# Patient Record
Sex: Female | Born: 1940 | Race: White | Hispanic: No | Marital: Married | Smoking: Never smoker
Health system: Southern US, Community
[De-identification: ages and names within clinical notes are randomized; demographics above are authoritative.]

## PROBLEM LIST (undated history)

## (undated) DIAGNOSIS — B351 Tinea unguium: Secondary | ICD-10-CM

## (undated) DIAGNOSIS — I5022 Chronic systolic (congestive) heart failure: Secondary | ICD-10-CM

## (undated) DIAGNOSIS — E785 Hyperlipidemia, unspecified: Secondary | ICD-10-CM

## (undated) DIAGNOSIS — G3184 Mild cognitive impairment, so stated: Secondary | ICD-10-CM

## (undated) DIAGNOSIS — A0472 Enterocolitis due to Clostridium difficile, not specified as recurrent: Secondary | ICD-10-CM

## (undated) DIAGNOSIS — I350 Nonrheumatic aortic (valve) stenosis: Secondary | ICD-10-CM

## (undated) DIAGNOSIS — F0390 Unspecified dementia without behavioral disturbance: Secondary | ICD-10-CM

## (undated) DIAGNOSIS — Z87448 Personal history of other diseases of urinary system: Secondary | ICD-10-CM

## (undated) DIAGNOSIS — I701 Atherosclerosis of renal artery: Secondary | ICD-10-CM

## (undated) DIAGNOSIS — I10 Essential (primary) hypertension: Secondary | ICD-10-CM

## (undated) DIAGNOSIS — Z8673 Personal history of transient ischemic attack (TIA), and cerebral infarction without residual deficits: Secondary | ICD-10-CM

## (undated) DIAGNOSIS — D696 Thrombocytopenia, unspecified: Secondary | ICD-10-CM

## (undated) DIAGNOSIS — Z8719 Personal history of other diseases of the digestive system: Secondary | ICD-10-CM

## (undated) HISTORY — DX: Atherosclerosis of renal artery: I70.1

## (undated) HISTORY — DX: Essential (primary) hypertension: I10

## (undated) HISTORY — DX: Nonrheumatic aortic (valve) stenosis: I35.0

## (undated) HISTORY — DX: Personal history of transient ischemic attack (TIA), and cerebral infarction without residual deficits: Z86.73

## (undated) HISTORY — DX: Enterocolitis due to Clostridium difficile, not specified as recurrent: A04.72

## (undated) HISTORY — DX: Chronic systolic (congestive) heart failure: I50.22

## (undated) HISTORY — DX: Hyperlipidemia, unspecified: E78.5

## (undated) HISTORY — DX: Personal history of other diseases of the digestive system: Z87.19

## (undated) HISTORY — DX: Unspecified dementia, unspecified severity, without behavioral disturbance, psychotic disturbance, mood disturbance, and anxiety: F03.90

## (undated) HISTORY — PX: CHOLECYSTECTOMY: SHX55

## (undated) HISTORY — DX: Tinea unguium: B35.1

## (undated) HISTORY — DX: Thrombocytopenia, unspecified: D69.6

## (undated) HISTORY — DX: Personal history of other diseases of urinary system: Z87.448

## (undated) HISTORY — DX: Mild cognitive impairment of uncertain or unknown etiology: G31.84

---

## 2000-03-29 ENCOUNTER — Emergency Department (HOSPITAL_COMMUNITY): Admission: EM | Admit: 2000-03-29 | Discharge: 2000-03-29 | Payer: Self-pay | Admitting: Emergency Medicine

## 2000-03-29 ENCOUNTER — Encounter: Payer: Self-pay | Admitting: Emergency Medicine

## 2004-08-27 ENCOUNTER — Ambulatory Visit: Payer: Self-pay | Admitting: Family Medicine

## 2004-08-27 ENCOUNTER — Ambulatory Visit: Payer: Self-pay | Admitting: Pulmonary Disease

## 2004-08-27 ENCOUNTER — Inpatient Hospital Stay (HOSPITAL_COMMUNITY): Admission: EM | Admit: 2004-08-27 | Discharge: 2004-09-04 | Payer: Self-pay | Admitting: Emergency Medicine

## 2004-08-27 ENCOUNTER — Ambulatory Visit: Payer: Self-pay | Admitting: Cardiology

## 2004-09-11 ENCOUNTER — Ambulatory Visit: Payer: Self-pay | Admitting: Family Medicine

## 2004-10-03 ENCOUNTER — Ambulatory Visit: Payer: Self-pay | Admitting: Family Medicine

## 2004-11-26 ENCOUNTER — Ambulatory Visit: Payer: Self-pay | Admitting: Family Medicine

## 2004-12-18 ENCOUNTER — Ambulatory Visit: Payer: Self-pay | Admitting: Family Medicine

## 2005-01-10 ENCOUNTER — Ambulatory Visit: Payer: Self-pay | Admitting: Sports Medicine

## 2005-01-13 ENCOUNTER — Ambulatory Visit: Payer: Self-pay

## 2005-03-13 ENCOUNTER — Ambulatory Visit: Payer: Self-pay | Admitting: Family Medicine

## 2005-03-28 ENCOUNTER — Ambulatory Visit: Payer: Self-pay | Admitting: Family Medicine

## 2005-05-08 ENCOUNTER — Emergency Department: Payer: Self-pay | Admitting: Emergency Medicine

## 2005-11-06 ENCOUNTER — Inpatient Hospital Stay (HOSPITAL_COMMUNITY): Admission: EM | Admit: 2005-11-06 | Discharge: 2005-11-08 | Payer: Self-pay | Admitting: Emergency Medicine

## 2005-11-07 ENCOUNTER — Encounter (INDEPENDENT_AMBULATORY_CARE_PROVIDER_SITE_OTHER): Payer: Self-pay | Admitting: Cardiology

## 2006-03-09 ENCOUNTER — Emergency Department (HOSPITAL_COMMUNITY): Admission: EM | Admit: 2006-03-09 | Discharge: 2006-03-09 | Payer: Self-pay | Admitting: Family Medicine

## 2006-08-28 ENCOUNTER — Other Ambulatory Visit: Payer: Self-pay

## 2006-08-28 ENCOUNTER — Inpatient Hospital Stay: Payer: Self-pay | Admitting: Internal Medicine

## 2006-09-12 ENCOUNTER — Inpatient Hospital Stay: Payer: Self-pay | Admitting: Internal Medicine

## 2006-09-12 ENCOUNTER — Other Ambulatory Visit: Payer: Self-pay

## 2006-11-03 ENCOUNTER — Inpatient Hospital Stay: Payer: Self-pay | Admitting: Internal Medicine

## 2006-11-03 ENCOUNTER — Other Ambulatory Visit: Payer: Self-pay

## 2006-12-10 DIAGNOSIS — I5022 Chronic systolic (congestive) heart failure: Secondary | ICD-10-CM

## 2006-12-10 DIAGNOSIS — E119 Type 2 diabetes mellitus without complications: Secondary | ICD-10-CM

## 2006-12-10 DIAGNOSIS — I10 Essential (primary) hypertension: Secondary | ICD-10-CM | POA: Insufficient documentation

## 2007-09-17 ENCOUNTER — Emergency Department: Payer: Self-pay | Admitting: Emergency Medicine

## 2007-11-12 ENCOUNTER — Other Ambulatory Visit: Payer: Self-pay

## 2007-11-12 ENCOUNTER — Emergency Department: Payer: Self-pay | Admitting: Internal Medicine

## 2007-12-05 ENCOUNTER — Emergency Department: Payer: Self-pay | Admitting: Emergency Medicine

## 2008-03-22 IMAGING — CR DG CHEST 1V PORT
1 series · 1 of 1 positions shown · non-contrast
Comparison: none

REASON FOR EXAM: sob cardiac room
COMMENTS:

[view not recorded]
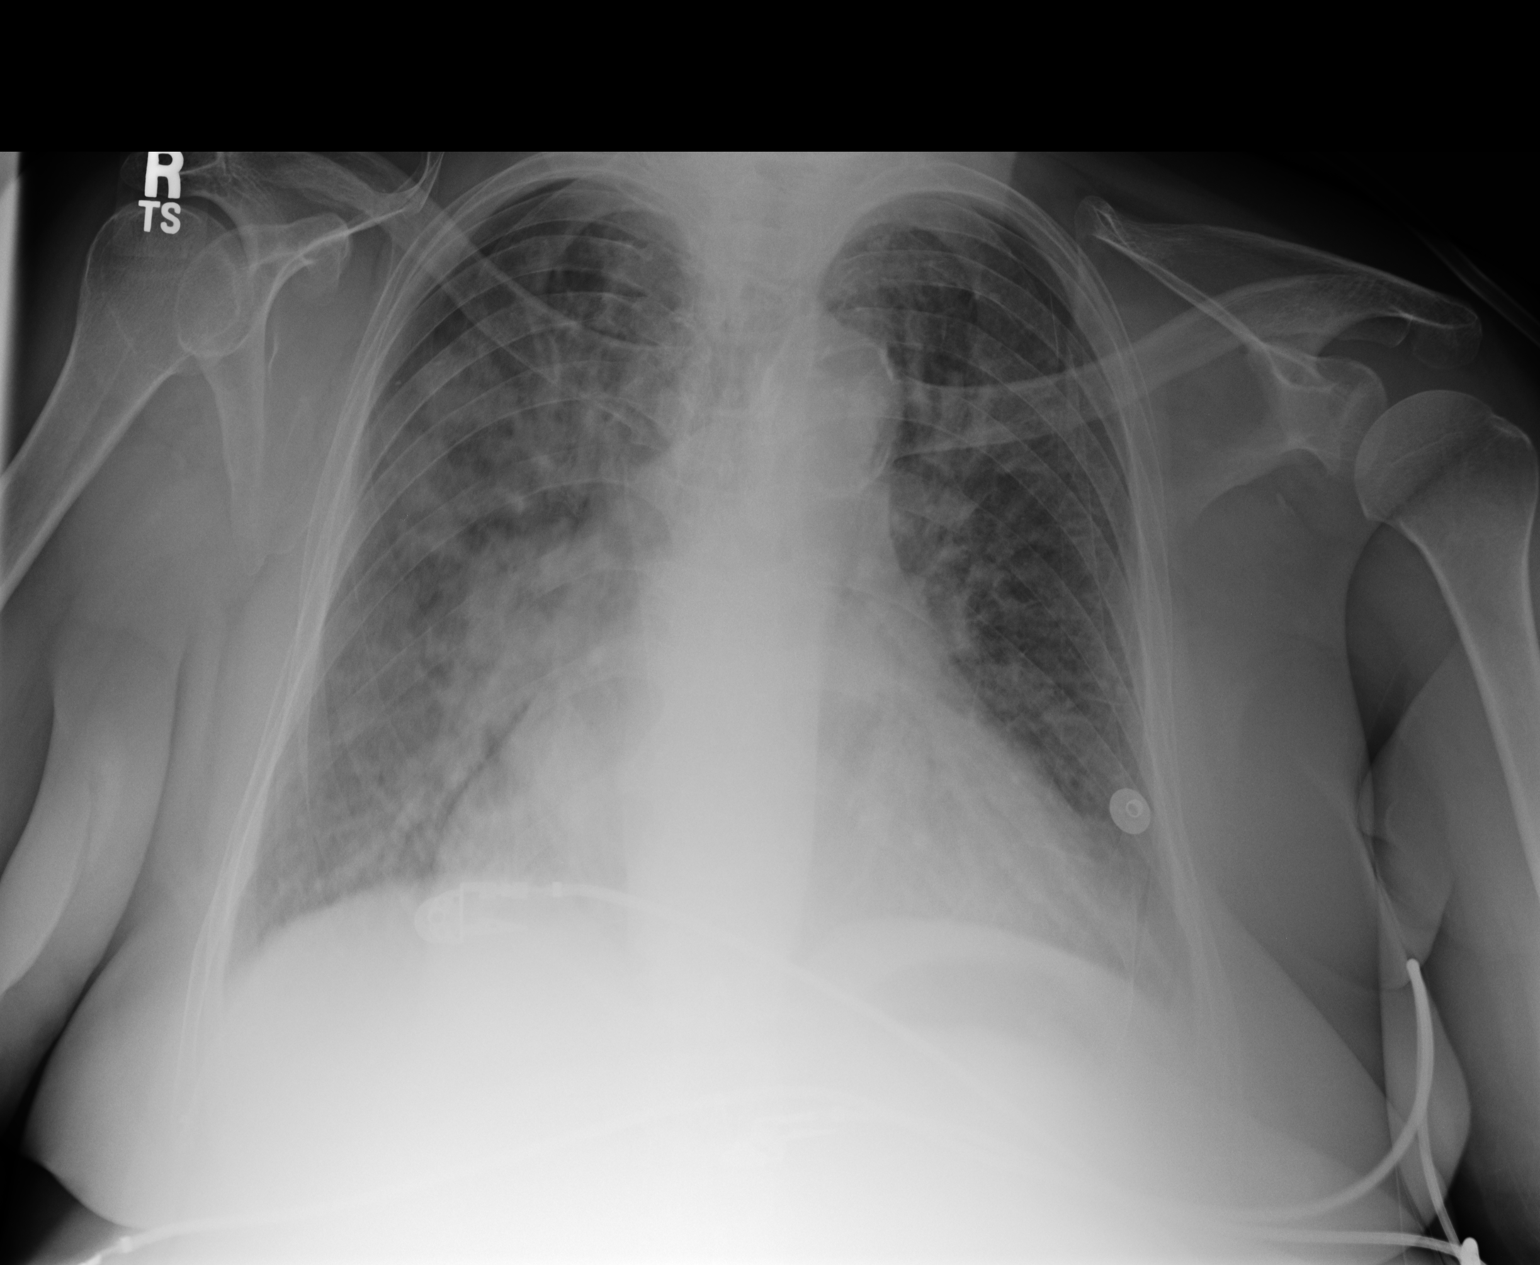

[1 of 1 positions shown; findings below may reference images not displayed]

PROCEDURE:     DXR - DXR PORTABLE CHEST SINGLE VIEW  - August 28, 2006  [DATE]

RESULT:     AP view of the chest shows prominence of the pulmonary
vascularity compatible with pulmonary vascular congestion. There is
increased interstitial density bilaterally consistent with interstitial
edema. In the RIGHT upper lobe there is a semi-consolidated density
compatible with focal pulmonary edema. The heart is enlarged. The general
appearance of the chest is consistent with congestive heart failure. No
frank pleural effusion is identified at this time.
IMPRESSION: 1)The chest shows changes consistent with congestive heart failure as noted
above.

## 2008-04-10 ENCOUNTER — Other Ambulatory Visit: Payer: Self-pay

## 2008-04-11 ENCOUNTER — Inpatient Hospital Stay: Payer: Self-pay | Admitting: Internal Medicine

## 2008-09-19 ENCOUNTER — Emergency Department: Payer: Self-pay | Admitting: Emergency Medicine

## 2008-10-21 ENCOUNTER — Inpatient Hospital Stay: Payer: Self-pay | Admitting: Internal Medicine

## 2009-03-08 ENCOUNTER — Inpatient Hospital Stay (HOSPITAL_COMMUNITY): Admission: EM | Admit: 2009-03-08 | Discharge: 2009-03-10 | Payer: Self-pay | Admitting: Emergency Medicine

## 2009-03-08 ENCOUNTER — Ambulatory Visit: Payer: Self-pay | Admitting: Cardiology

## 2009-03-09 ENCOUNTER — Encounter (INDEPENDENT_AMBULATORY_CARE_PROVIDER_SITE_OTHER): Payer: Self-pay | Admitting: Internal Medicine

## 2009-04-01 ENCOUNTER — Emergency Department: Payer: Self-pay | Admitting: Emergency Medicine

## 2009-05-02 ENCOUNTER — Inpatient Hospital Stay: Payer: Self-pay | Admitting: Internal Medicine

## 2009-05-16 ENCOUNTER — Ambulatory Visit: Payer: Self-pay | Admitting: Family

## 2009-10-15 ENCOUNTER — Inpatient Hospital Stay: Payer: Self-pay | Admitting: Internal Medicine

## 2009-11-13 HISTORY — PX: CARDIAC CATHETERIZATION: SHX172

## 2009-11-15 ENCOUNTER — Ambulatory Visit: Payer: Self-pay | Admitting: Cardiovascular Disease

## 2009-11-19 ENCOUNTER — Emergency Department: Payer: Self-pay | Admitting: Emergency Medicine

## 2010-03-02 ENCOUNTER — Inpatient Hospital Stay: Payer: Self-pay | Admitting: Internal Medicine

## 2010-06-15 ENCOUNTER — Inpatient Hospital Stay: Payer: Self-pay | Admitting: Internal Medicine

## 2010-06-24 ENCOUNTER — Inpatient Hospital Stay (HOSPITAL_COMMUNITY): Admission: EM | Admit: 2010-06-24 | Discharge: 2010-06-26 | Payer: Self-pay | Admitting: Emergency Medicine

## 2010-06-24 ENCOUNTER — Ambulatory Visit: Payer: Self-pay | Admitting: Family Medicine

## 2010-12-26 LAB — CBC
HCT: 42.1 % (ref 36.0–46.0)
HCT: 46.3 % — ABNORMAL HIGH (ref 36.0–46.0)
Hemoglobin: 15.6 g/dL — ABNORMAL HIGH (ref 12.0–15.0)
MCH: 28.6 pg (ref 26.0–34.0)
MCHC: 33.5 g/dL (ref 30.0–36.0)
MCHC: 33.7 g/dL (ref 30.0–36.0)
MCV: 84.7 fL (ref 78.0–100.0)
MCV: 84.8 fL (ref 78.0–100.0)
Platelets: 234 10*3/uL (ref 150–400)
Platelets: 244 10*3/uL (ref 150–400)
RBC: 5.46 MIL/uL — ABNORMAL HIGH (ref 3.87–5.11)
RDW: 13.4 % (ref 11.5–15.5)
RDW: 13.7 % (ref 11.5–15.5)
WBC: 9.1 10*3/uL (ref 4.0–10.5)
WBC: 9.8 10*3/uL (ref 4.0–10.5)

## 2010-12-26 LAB — URINE CULTURE: Colony Count: 15000

## 2010-12-26 LAB — URINE MICROSCOPIC-ADD ON

## 2010-12-26 LAB — BASIC METABOLIC PANEL
BUN: 8 mg/dL (ref 6–23)
CO2: 29 mEq/L (ref 19–32)
Calcium: 9 mg/dL (ref 8.4–10.5)
Chloride: 96 mEq/L (ref 96–112)
Creatinine, Ser: 0.52 mg/dL (ref 0.4–1.2)
GFR calc Af Amer: >60 mL/min (ref >60)
GFR calc non Af Amer: >60 mL/min (ref >60)
Glucose, Bld: 315 mg/dL — ABNORMAL HIGH (ref 70–99)
Potassium: 4.4 mEq/L (ref 3.5–5.1)
Sodium: 135 mEq/L (ref 135–145)

## 2010-12-26 LAB — URINALYSIS, ROUTINE W REFLEX MICROSCOPIC
Bilirubin Urine: NEGATIVE
Glucose, UA: >1000 mg/dL — AB
Ketones, ur: 15 mg/dL — AB
Nitrite: NEGATIVE
Protein, ur: 100 mg/dL — AB
Specific Gravity, Urine: 1.025 (ref 1.005–1.030)
Urobilinogen, UA: 0.2 mg/dL (ref 0.0–1.0)
pH: 6.5 (ref 5.0–8.0)

## 2010-12-26 LAB — GRAM STAIN

## 2010-12-26 LAB — LIPID PANEL
Cholesterol: 233 mg/dL — ABNORMAL HIGH (ref 0–200)
HDL: 39 mg/dL — ABNORMAL LOW (ref >39)
Total CHOL/HDL Ratio: 6 RATIO

## 2010-12-26 LAB — GLUCOSE, CAPILLARY
Glucose-Capillary: 197 mg/dL — ABNORMAL HIGH (ref 70–99)
Glucose-Capillary: 204 mg/dL — ABNORMAL HIGH (ref 70–99)
Glucose-Capillary: 220 mg/dL — ABNORMAL HIGH (ref 70–99)
Glucose-Capillary: 226 mg/dL — ABNORMAL HIGH (ref 70–99)
Glucose-Capillary: 252 mg/dL — ABNORMAL HIGH (ref 70–99)
Glucose-Capillary: 280 mg/dL — ABNORMAL HIGH (ref 70–99)
Glucose-Capillary: 282 mg/dL — ABNORMAL HIGH (ref 70–99)
Glucose-Capillary: 291 mg/dL — ABNORMAL HIGH (ref 70–99)
Glucose-Capillary: 326 mg/dL — ABNORMAL HIGH (ref 70–99)

## 2010-12-26 LAB — CARDIAC PANEL(CRET KIN+CKTOT+MB+TROPI)
Relative Index: INVALID (ref 0.0–2.5)
Troponin I: 0.03 ng/mL (ref 0.00–0.06)

## 2010-12-26 LAB — MRSA PCR SCREENING: MRSA by PCR: NEGATIVE

## 2011-01-02 ENCOUNTER — Inpatient Hospital Stay (HOSPITAL_COMMUNITY)
Admission: EM | Admit: 2011-01-02 | Discharge: 2011-01-05 | DRG: 293 | Disposition: A | Discharge status: Home / Self Care | Payer: Medicare PPO | Source: Ambulatory Visit | Attending: Internal Medicine | Admitting: Internal Medicine

## 2011-01-02 ENCOUNTER — Emergency Department: Payer: Self-pay | Admitting: Unknown Physician Specialty

## 2011-01-02 DIAGNOSIS — IMO0001 Reserved for inherently not codable concepts without codable children: Secondary | ICD-10-CM | POA: Diagnosis present

## 2011-01-02 DIAGNOSIS — E785 Hyperlipidemia, unspecified: Secondary | ICD-10-CM | POA: Diagnosis present

## 2011-01-02 DIAGNOSIS — I509 Heart failure, unspecified: Secondary | ICD-10-CM | POA: Diagnosis present

## 2011-01-02 DIAGNOSIS — F0390 Unspecified dementia without behavioral disturbance: Secondary | ICD-10-CM | POA: Diagnosis present

## 2011-01-02 DIAGNOSIS — E669 Obesity, unspecified: Secondary | ICD-10-CM | POA: Diagnosis present

## 2011-01-02 DIAGNOSIS — Z794 Long term (current) use of insulin: Secondary | ICD-10-CM

## 2011-01-02 DIAGNOSIS — I5023 Acute on chronic systolic (congestive) heart failure: Principal | ICD-10-CM | POA: Diagnosis present

## 2011-01-02 DIAGNOSIS — Z87891 Personal history of nicotine dependence: Secondary | ICD-10-CM

## 2011-01-02 DIAGNOSIS — I10 Essential (primary) hypertension: Secondary | ICD-10-CM | POA: Diagnosis present

## 2011-01-02 LAB — POCT I-STAT, CHEM 8
Calcium, Ion: 1.12 mmol/L (ref 1.12–1.32)
Chloride: 100 mEq/L (ref 96–112)
Glucose, Bld: 348 mg/dL — ABNORMAL HIGH (ref 70–99)
HCT: 43 % (ref 36.0–46.0)
Hemoglobin: 14.6 g/dL (ref 12.0–15.0)
TCO2: 30 mmol/L (ref 0–100)

## 2011-01-03 ENCOUNTER — Emergency Department (HOSPITAL_COMMUNITY): Payer: Medicare PPO

## 2011-01-03 ENCOUNTER — Observation Stay (HOSPITAL_COMMUNITY): Payer: Medicare PPO

## 2011-01-03 LAB — GLUCOSE, CAPILLARY
Glucose-Capillary: 344 mg/dL — ABNORMAL HIGH (ref 70–99)
Glucose-Capillary: 269 mg/dL — ABNORMAL HIGH (ref 70–99)
Glucose-Capillary: 214 mg/dL — ABNORMAL HIGH (ref 70–99)
Glucose-Capillary: 244 mg/dL — ABNORMAL HIGH (ref 70–99)

## 2011-01-03 LAB — DIFFERENTIAL
Eosinophils Relative: 1 % (ref 0–5)
Lymphocytes Relative: 22 % (ref 12–46)
Lymphs Abs: 2 10*3/uL (ref 0.7–4.0)
Monocytes Relative: 8 % (ref 3–12)

## 2011-01-03 LAB — CBC
HCT: 40.8 % (ref 36.0–46.0)
HCT: 41.4 % (ref 36.0–46.0)
Hemoglobin: 13.7 g/dL (ref 12.0–15.0)
MCH: 29.2 pg (ref 26.0–34.0)
MCHC: 33.6 g/dL (ref 30.0–36.0)
MCV: 84.8 fL (ref 78.0–100.0)
MCV: 85 fL (ref 78.0–100.0)
RBC: 4.87 MIL/uL (ref 3.87–5.11)
RDW: 13.6 % (ref 11.5–15.5)
WBC: 8.6 10*3/uL (ref 4.0–10.5)
WBC: 9.2 10*3/uL (ref 4.0–10.5)

## 2011-01-03 LAB — CK TOTAL AND CKMB (NOT AT ARMC): Total CK: 33 U/L (ref 7–177)

## 2011-01-03 LAB — BASIC METABOLIC PANEL
Chloride: 99 mEq/L (ref 96–112)
GFR calc non Af Amer: >60 mL/min (ref >60)
Potassium: 4.3 mEq/L (ref 3.5–5.1)
Sodium: 137 mEq/L (ref 135–145)

## 2011-01-03 LAB — POCT CARDIAC MARKERS: Troponin i, poc: <0.05 ng/mL (ref 0.00–0.09)

## 2011-01-03 LAB — CARDIAC PANEL(CRET KIN+CKTOT+MB+TROPI)
CK, MB: 1.2 ng/mL (ref 0.3–4.0)
CK, MB: 1.5 ng/mL (ref 0.3–4.0)
Relative Index: INVALID (ref 0.0–2.5)
Troponin I: 0.02 ng/mL (ref 0.00–0.06)

## 2011-01-03 MED ORDER — XENON XE 133 GAS: 10.0000 | GAS_FOR_INHALATION | Freq: Once | RESPIRATORY_TRACT | Status: AC | PRN

## 2011-01-03 MED ORDER — TECHNETIUM TO 99M ALBUMIN AGGREGATED: 6.0000 | Freq: Once | INTRAVENOUS | Status: AC | PRN

## 2011-01-04 LAB — BASIC METABOLIC PANEL
BUN: 33 mg/dL — ABNORMAL HIGH (ref 6–23)
CO2: 32 mEq/L (ref 19–32)
Calcium: 9.2 mg/dL (ref 8.4–10.5)
Creatinine, Ser: 0.84 mg/dL (ref 0.4–1.2)
Glucose, Bld: 242 mg/dL — ABNORMAL HIGH (ref 70–99)

## 2011-01-04 LAB — GLUCOSE, CAPILLARY: Glucose-Capillary: 190 mg/dL — ABNORMAL HIGH (ref 70–99)

## 2011-01-05 LAB — GLUCOSE, CAPILLARY: Glucose-Capillary: 206 mg/dL — ABNORMAL HIGH (ref 70–99)

## 2011-01-05 LAB — BASIC METABOLIC PANEL
Chloride: 99 mEq/L (ref 96–112)
Creatinine, Ser: 0.67 mg/dL (ref 0.4–1.2)
GFR calc Af Amer: >60 mL/min (ref >60)
Potassium: 3.9 mEq/L (ref 3.5–5.1)

## 2011-01-07 NOTE — H&P (Signed)
NAME:  [name], [name]             ACCOUNT NO.:  [id number]  MEDICAL RECORD NO.:  [id number]           PATIENT TYPE:  LOCATION:                                 FACILITY:  PHYSICIAN:  Jeoffrey Massed, MD    DATE OF BIRTH:  [birth year]  DATE OF ADMISSION: DATE OF DISCHARGE:                             HISTORY & PHYSICAL   PRIMARY CARE PRACTITIONER:  The patient does not remember.  CHIEF COMPLAINT:  Shortness of breath x1 day.  HISTORY OF PRESENT ILLNESS:  The patient is a 70 year old female with a history of systolic heart failure, hypertension, diabetes, dyslipidemia, and early dementia, was brought to the ED by her husband for shortness of breath.  Please note that this patient is a very unreliable historian and most of this history is obtained from the husband who is at bedside. Per the patient and per the patient's husband, apparently this started sometime yesterday afternoon.  It is unknown whether this is actually related to any exertion as the husband says that she looked fine when she went back and forth to the bathroom.  She has chronic two-pillow orthopnea and there is no change to this.  There is no history of PND. There is no cough.  There is no history of fever.  No chest pain or palpitations.  No history of nausea, vomiting, abdominal pain, or dysuria.  ALLERGIES:  PENICILLIN.  PAST MEDICAL HISTORY: 1. Systolic heart failure.  The patient claims she had a cardiac cath     around 6-7 months ago that was negative.  This was apparently done     in [facility]. 2. Systolic heart failure, likely nonischemic given above history. 3. Hypertension. 4. Diabetes. 5. Dyslipidemia.  PAST SURGICAL HISTORY:  None.  MEDICATIONS AT HOME:  Unknown.  Apparently, the patient is on blood pressure medications and Lantus insulin along with NovoLog.  FAMILY HISTORY:  Her brother had coronary artery disease and hypertension.  Father had lung cancer.  SOCIAL HISTORY:  Lives  with the husband.  She is an ex-smoker.  There is no other history of substance abuse.  PHYSICAL EXAMINATION:  VITAL SIGNS:  Temperature of 98.4, heart rate of 116, blood pressure 143/92, respiration of 16, pulse ox of 93% on room air. GENERAL:  Elderly white female lying in bed, does not appear to be in distress.  Not using any accessory muscles, easily able to talk in full sentences. HEENT:  Atraumatic, normocephalic.  Pupils equally reactive to light and accommodation. NECK:  Supple. CHEST:  There is good air entry bilaterally, very few bibasilar rales. CARDIOVASCULAR:  Heart sounds are regular.  No murmurs heard.  Slightly tachycardic. ABDOMEN:  Soft, nontender, nondistended.  The patient is obese. EXTREMITIES:  No edema.  Both the lower extremities are warm to touch. NEUROLOGY:  The patient is alert and awake.  There are no focal neurological deficits.  LABORATORY DATA:  CBC shows WBC of 9.2, hemoglobin of 14.2, hematocrit of 41.4, and a platelet count of 237,000.  ISTAT chemistry shows a sodium of 137, potassium of 4.5, chloride of 100, glucose of 348, BUN of 24, and a creatinine  of 0.8.  BNP is 393.  RADIOLOGICAL STUDIES: 1. Chest x-ray shows cardiomegaly without acute disease. 2. EKG, sinus tachycardia at a rate of around 100 beats per minute     with T-wave inversions in the lateral leads.  ASSESSMENT: 1. Shortness of breath, possibly related to her congestive heart     failure exacerbation. 2. Hypertension, controlled currently. 3. History of diabetes. 4. History of dyslipidemia.  PLAN: 1. The patient will be admitted to telemetry unit. 2. She will be given IV Lasix. 3. Strict I and O will be done along with daily weights. 4. We do not know what medications the patient has been taking as an     outpatient as the husband or she do not have recollection of any of     the names, so at this point we will go ahead and start her on Coreg     and lisinopril and also  simvastatin. 5. Regarding the diabetes, she should be placed on Lantus along with a     sliding scale insulin. 6. Cardiac enzymes will be cycled. 7. Further plan will depend as the patient's clinical course evolves. 8. Code status, full code. 9. DVT prophylaxis with Lovenox.  Total time spent 45 minutes.     Jeoffrey Massed, MD     SG/MEDQ  D:  01/03/2011  T:  01/03/2011  Job:  [id number]  Electronically Signed by Jeoffrey Massed  on 01/07/2011 05:04:06 PM

## 2011-01-21 LAB — DIFFERENTIAL
Basophils Relative: 0 % (ref 0–1)
Basophils Relative: 1 % (ref 0–1)
Eosinophils Absolute: 0.1 10*3/uL (ref 0.0–0.7)
Eosinophils Relative: 1 % (ref 0–5)
Lymphocytes Relative: 24 % (ref 12–46)
Lymphs Abs: 1.5 10*3/uL (ref 0.7–4.0)
Lymphs Abs: 2 10*3/uL (ref 0.7–4.0)
Monocytes Absolute: 0.5 10*3/uL (ref 0.1–1.0)
Monocytes Relative: 4 % (ref 3–12)
Monocytes Relative: 6 % (ref 3–12)
Neutro Abs: 5.8 10*3/uL (ref 1.7–7.7)
Neutrophils Relative %: 68 % (ref 43–77)
Neutrophils Relative %: 78 % — ABNORMAL HIGH (ref 43–77)

## 2011-01-21 LAB — BASIC METABOLIC PANEL
BUN: 17 mg/dL (ref 6–23)
BUN: 19 mg/dL (ref 6–23)
BUN: 22 mg/dL (ref 6–23)
CO2: 27 mEq/L (ref 19–32)
Calcium: 8.7 mg/dL (ref 8.4–10.5)
Calcium: 8.8 mg/dL (ref 8.4–10.5)
Chloride: 93 mEq/L — ABNORMAL LOW (ref 96–112)
Creatinine, Ser: 0.5 mg/dL (ref 0.4–1.2)
Creatinine, Ser: 0.69 mg/dL (ref 0.4–1.2)
GFR calc Af Amer: >60 mL/min (ref >60)
GFR calc non Af Amer: >60 mL/min (ref >60)
GFR calc non Af Amer: >60 mL/min (ref >60)
Glucose, Bld: 251 mg/dL — ABNORMAL HIGH (ref 70–99)
Potassium: 5 mEq/L (ref 3.5–5.1)
Sodium: 130 mEq/L — ABNORMAL LOW (ref 135–145)

## 2011-01-21 LAB — GLUCOSE, CAPILLARY
Glucose-Capillary: 201 mg/dL — ABNORMAL HIGH (ref 70–99)
Glucose-Capillary: 213 mg/dL — ABNORMAL HIGH (ref 70–99)
Glucose-Capillary: 241 mg/dL — ABNORMAL HIGH (ref 70–99)
Glucose-Capillary: 249 mg/dL — ABNORMAL HIGH (ref 70–99)
Glucose-Capillary: 254 mg/dL — ABNORMAL HIGH (ref 70–99)
Glucose-Capillary: 278 mg/dL — ABNORMAL HIGH (ref 70–99)
Glucose-Capillary: 280 mg/dL — ABNORMAL HIGH (ref 70–99)
Glucose-Capillary: 297 mg/dL — ABNORMAL HIGH (ref 70–99)
Glucose-Capillary: 432 mg/dL — ABNORMAL HIGH (ref 70–99)

## 2011-01-21 LAB — COMPREHENSIVE METABOLIC PANEL
Albumin: 3.2 g/dL — ABNORMAL LOW (ref 3.5–5.2)
Alkaline Phosphatase: 110 U/L (ref 39–117)
BUN: 18 mg/dL (ref 6–23)
Calcium: 9.1 mg/dL (ref 8.4–10.5)
Creatinine, Ser: 0.61 mg/dL (ref 0.4–1.2)
Glucose, Bld: 342 mg/dL — ABNORMAL HIGH (ref 70–99)
Total Protein: 6.2 g/dL (ref 6.0–8.3)

## 2011-01-21 LAB — CBC
HCT: 42.1 % (ref 36.0–46.0)
HCT: 43.5 % (ref 36.0–46.0)
HCT: 48.1 % — ABNORMAL HIGH (ref 36.0–46.0)
Hemoglobin: 15 g/dL (ref 12.0–15.0)
MCHC: 34.3 g/dL (ref 30.0–36.0)
MCHC: 34.6 g/dL (ref 30.0–36.0)
MCV: 84.9 fL (ref 78.0–100.0)
MCV: 84.9 fL (ref 78.0–100.0)
MCV: 86.2 fL (ref 78.0–100.0)
Platelets: 180 10*3/uL (ref 150–400)
Platelets: 191 10*3/uL (ref 150–400)
Platelets: 204 10*3/uL (ref 150–400)
Platelets: 216 10*3/uL (ref 150–400)
RDW: 12.9 % (ref 11.5–15.5)
RDW: 13.1 % (ref 11.5–15.5)
WBC: 6 10*3/uL (ref 4.0–10.5)
WBC: 9 10*3/uL (ref 4.0–10.5)

## 2011-01-21 LAB — POCT CARDIAC MARKERS
Myoglobin, poc: 39.3 ng/mL (ref 12–200)
Troponin i, poc: <0.05 ng/mL (ref 0.00–0.09)

## 2011-01-21 LAB — PROTIME-INR: Prothrombin Time: 12.4 seconds (ref 11.6–15.2)

## 2011-01-21 LAB — CARDIAC PANEL(CRET KIN+CKTOT+MB+TROPI)
CK, MB: 1.2 ng/mL (ref 0.3–4.0)
CK, MB: 1.4 ng/mL (ref 0.3–4.0)
Relative Index: INVALID (ref 0.0–2.5)
Relative Index: INVALID (ref 0.0–2.5)
Total CK: 25 U/L (ref 7–177)
Troponin I: 0.03 ng/mL (ref 0.00–0.06)

## 2011-01-21 LAB — LIPID PANEL
Cholesterol: 289 mg/dL — ABNORMAL HIGH (ref 0–200)
Triglycerides: 504 mg/dL — ABNORMAL HIGH (ref <150)

## 2011-01-21 LAB — CK TOTAL AND CKMB (NOT AT ARMC)
Relative Index: INVALID (ref 0.0–2.5)
Total CK: 30 U/L (ref 7–177)

## 2011-01-21 LAB — D-DIMER, QUANTITATIVE: D-Dimer, Quant: 0.27 ug/mL-FEU (ref 0.00–0.48)

## 2011-01-21 LAB — HEMOGLOBIN A1C: Mean Plasma Glucose: 295 mg/dL

## 2011-01-21 LAB — TSH: TSH: 0.986 u[IU]/mL (ref 0.350–4.500)

## 2011-01-27 ENCOUNTER — Emergency Department (HOSPITAL_COMMUNITY): Payer: Medicare PPO

## 2011-01-27 ENCOUNTER — Inpatient Hospital Stay (HOSPITAL_COMMUNITY)
Admission: EM | Admit: 2011-01-27 | Discharge: 2011-02-04 | DRG: 292 | Disposition: A | Discharge status: Skilled Nursing Facility | Payer: Medicare PPO | Attending: Internal Medicine | Admitting: Internal Medicine

## 2011-01-27 DIAGNOSIS — IMO0001 Reserved for inherently not codable concepts without codable children: Secondary | ICD-10-CM | POA: Diagnosis present

## 2011-01-27 DIAGNOSIS — I2589 Other forms of chronic ischemic heart disease: Secondary | ICD-10-CM | POA: Diagnosis present

## 2011-01-27 DIAGNOSIS — I5043 Acute on chronic combined systolic (congestive) and diastolic (congestive) heart failure: Principal | ICD-10-CM | POA: Diagnosis present

## 2011-01-27 DIAGNOSIS — R0902 Hypoxemia: Secondary | ICD-10-CM | POA: Diagnosis present

## 2011-01-27 DIAGNOSIS — Z7982 Long term (current) use of aspirin: Secondary | ICD-10-CM

## 2011-01-27 DIAGNOSIS — Z794 Long term (current) use of insulin: Secondary | ICD-10-CM

## 2011-01-27 DIAGNOSIS — R269 Unspecified abnormalities of gait and mobility: Secondary | ICD-10-CM | POA: Diagnosis present

## 2011-01-27 DIAGNOSIS — I509 Heart failure, unspecified: Secondary | ICD-10-CM | POA: Diagnosis present

## 2011-01-27 DIAGNOSIS — E876 Hypokalemia: Secondary | ICD-10-CM | POA: Diagnosis not present

## 2011-01-27 DIAGNOSIS — E871 Hypo-osmolality and hyponatremia: Secondary | ICD-10-CM | POA: Diagnosis present

## 2011-01-27 DIAGNOSIS — E785 Hyperlipidemia, unspecified: Secondary | ICD-10-CM | POA: Diagnosis present

## 2011-01-27 DIAGNOSIS — I10 Essential (primary) hypertension: Secondary | ICD-10-CM | POA: Diagnosis present

## 2011-01-27 DIAGNOSIS — F0390 Unspecified dementia without behavioral disturbance: Secondary | ICD-10-CM | POA: Diagnosis present

## 2011-01-27 DIAGNOSIS — I359 Nonrheumatic aortic valve disorder, unspecified: Secondary | ICD-10-CM | POA: Diagnosis present

## 2011-01-27 DIAGNOSIS — Z79899 Other long term (current) drug therapy: Secondary | ICD-10-CM

## 2011-01-27 LAB — LACTIC ACID, PLASMA: Lactic Acid, Venous: 2 mmol/L (ref 0.5–2.2)

## 2011-01-27 LAB — URINALYSIS, ROUTINE W REFLEX MICROSCOPIC
Glucose, UA: >1000 mg/dL — AB
Ketones, ur: NEGATIVE mg/dL
pH: 7 (ref 5.0–8.0)

## 2011-01-27 LAB — CBC
MCH: 27.7 pg (ref 26.0–34.0)
Platelets: 176 10*3/uL (ref 150–400)
RBC: 5.05 MIL/uL (ref 3.87–5.11)

## 2011-01-27 LAB — URINE MICROSCOPIC-ADD ON

## 2011-01-27 LAB — CARDIAC PANEL(CRET KIN+CKTOT+MB+TROPI)
CK, MB: 1.5 ng/mL (ref 0.3–4.0)
Total CK: 28 U/L (ref 7–177)

## 2011-01-27 LAB — DIFFERENTIAL
Basophils Absolute: 0.1 10*3/uL (ref 0.0–0.1)
Basophils Relative: 1 % (ref 0–1)
Eosinophils Absolute: 0.1 10*3/uL (ref 0.0–0.7)
Monocytes Relative: 7 % (ref 3–12)
Neutrophils Relative %: 78 % — ABNORMAL HIGH (ref 43–77)

## 2011-01-27 LAB — GLUCOSE, CAPILLARY: Glucose-Capillary: 205 mg/dL — ABNORMAL HIGH (ref 70–99)

## 2011-01-27 LAB — BASIC METABOLIC PANEL
BUN: 21 mg/dL (ref 6–23)
Calcium: 8.8 mg/dL (ref 8.4–10.5)
Creatinine, Ser: 0.55 mg/dL (ref 0.4–1.2)
GFR calc non Af Amer: >60 mL/min (ref >60)

## 2011-01-27 LAB — PROTIME-INR: Prothrombin Time: 12.5 seconds (ref 11.6–15.2)

## 2011-01-27 LAB — POCT CARDIAC MARKERS

## 2011-01-28 ENCOUNTER — Observation Stay (HOSPITAL_COMMUNITY): Payer: Medicare PPO

## 2011-01-28 LAB — CARDIAC PANEL(CRET KIN+CKTOT+MB+TROPI)
CK, MB: 1.5 ng/mL (ref 0.3–4.0)
Relative Index: INVALID (ref 0.0–2.5)
Troponin I: 0.05 ng/mL (ref 0.00–0.06)

## 2011-01-28 LAB — GLUCOSE, CAPILLARY
Glucose-Capillary: 166 mg/dL — ABNORMAL HIGH (ref 70–99)
Glucose-Capillary: 247 mg/dL — ABNORMAL HIGH (ref 70–99)

## 2011-01-28 LAB — COMPREHENSIVE METABOLIC PANEL
AST: 18 U/L (ref 0–37)
Albumin: 3.2 g/dL — ABNORMAL LOW (ref 3.5–5.2)
Calcium: 8.9 mg/dL (ref 8.4–10.5)
Creatinine, Ser: 0.69 mg/dL (ref 0.4–1.2)
GFR calc Af Amer: >60 mL/min (ref >60)
GFR calc non Af Amer: >60 mL/min (ref >60)

## 2011-01-28 LAB — CBC
Hemoglobin: 13.2 g/dL (ref 12.0–15.0)
RBC: 4.68 MIL/uL (ref 3.87–5.11)

## 2011-01-28 LAB — URINE CULTURE: Culture  Setup Time: 201204161358

## 2011-01-28 LAB — HEMOGLOBIN A1C
Hgb A1c MFr Bld: 11.7 % — ABNORMAL HIGH (ref <5.7)
Mean Plasma Glucose: 289 mg/dL — ABNORMAL HIGH (ref <117)

## 2011-01-29 DIAGNOSIS — I509 Heart failure, unspecified: Secondary | ICD-10-CM

## 2011-01-29 DIAGNOSIS — I10 Essential (primary) hypertension: Secondary | ICD-10-CM

## 2011-01-29 LAB — GLUCOSE, CAPILLARY
Glucose-Capillary: 159 mg/dL — ABNORMAL HIGH (ref 70–99)
Glucose-Capillary: 178 mg/dL — ABNORMAL HIGH (ref 70–99)

## 2011-01-29 LAB — BASIC METABOLIC PANEL
BUN: 28 mg/dL — ABNORMAL HIGH (ref 6–23)
CO2: 33 mEq/L — ABNORMAL HIGH (ref 19–32)
Chloride: 97 mEq/L (ref 96–112)
Creatinine, Ser: 0.81 mg/dL (ref 0.4–1.2)
Glucose, Bld: 159 mg/dL — ABNORMAL HIGH (ref 70–99)

## 2011-01-29 NOTE — Discharge Summary (Signed)
[name], [name]             ACCOUNT NO.:  [id number]  MEDICAL RECORD NO.:  [id number]           PATIENT TYPE:  O  LOCATION:  4714                         FACILITY:  [facility]  PHYSICIAN:  Clydia Llano, MD       DATE OF BIRTH:  1941/07/08  DATE OF ADMISSION:  01/02/2011 DATE OF DISCHARGE:                              DISCHARGE SUMMARY   PRIMARY CARE PHYSICIAN:  Dr. Welton Flakes in [facility].  REASON FOR ADMISSION:  Shortness of breath.  DISCHARGE DIAGNOSES: 1. Acute-on-chronic systolic heart failure. 2. Hypertension. 3. Diabetes mellitus type 2 uncontrolled. 4. Dyslipidemia.  DISCHARGE MEDICATIONS: 1. Furosemide 20 mg p.o. daily. 2. Lantus 20 units daily at bedtime. 3. Metformin 500 mg p.o. b.i.d. 4. Carvedilol 6.25 mg p.o. b.i.d. 5. Lisinopril 10 mg p.o. daily. 6. Pravachol 10 mg p.o. daily.  BRIEF HISTORY EXAMINATION:  Mrs. Silvio is a 71 year old Caucasian female with history of systolic heart failure, hypertension and diabetes.  The patient has early dementia as well.  Brought to the emergency department by husband because of shortness of breath.  Please note that the patient is very unreliable historian.  Most of the history is obtained from the husband who is at bedside at the time of admission. Apparently, the patient started to have some shortness of breath the day before the admission and the history was very difficult to obtain and it was not clear it is exertion related are not.  The patient has chronic two-pillow orthopnea and there is no change in this.  There is no history of PND, there is no cough, there is no fever, chest pain, palpitations.  The patient admitted for further evaluation.  Upon initial evaluation, BNP was 393.  The patient admitted for further eval.  RADIOLOGY: 1. V/Q scan showed very low probability for acute PE. 2. Chest x-ray January 03, 2011, showed cardiomegaly without acute     disease.  BRIEF HOSPITAL COURSE: 1.  Acute-on-chronic systolic heart failure, mild exacerbation.  The     patient admitted to the hospital for further evaluation.  As     mentioned above, the BNP was high.  The patient started on gentle     diuresis because symptoms are not that significant because symptoms     are mild to moderate.  With the diuresis, the patient's shortness     of breath got better.  Please note that the patient has positive D-     dimers upon time of admission.  V/Q scan was done and showed very     low probability for PE.  At the time of discharge, the patient was     fine, no shortness of breath, complaining about no chest pain.     Echocardiogram was 70 years old in May 2010 showing a left     ventricular ejection fraction of 35%. 2. Diabetes mellitus type 2 controlled.  The patient's hemoglobin A1c     is 12.7 which correlated to mean plasma glucose of 318.  The     patient started on insulin.  The patient will be on the Lantus     insulin  20 units as well as metformin 500 units twice a day.  The     patient to follow up with her primary care physician for further     adjustment.  Seems like the patient was been on insulin before but     she is nonadherent to her insulin. 3. Dyslipidemia.  Her pravastatin was continued. 4. Hypertension.  Blood pressure was reasonably controlled during the     hospital stay.  Systolic blood pressure runs from 104-138.  Her     Coreg and lisinopril continued.  DISCHARGE INSTRUCTIONS: 1. Activity as tolerated. 2. Diet:  Carbohydrate modified diet.  DISPOSITION:  Home.     Clydia Llano, MD     ME/MEDQ  D:  01/05/2011  T:  01/05/2011  Job:  [id number]  cc:   Beverely Risen, MD  Electronically Signed by Clydia Llano  on 01/29/2011 03:39:01 PM

## 2011-01-30 ENCOUNTER — Inpatient Hospital Stay (HOSPITAL_COMMUNITY): Payer: Medicare PPO

## 2011-01-30 LAB — GLUCOSE, CAPILLARY: Glucose-Capillary: 136 mg/dL — ABNORMAL HIGH (ref 70–99)

## 2011-01-30 LAB — BASIC METABOLIC PANEL
BUN: 35 mg/dL — ABNORMAL HIGH (ref 6–23)
CO2: 32 mEq/L (ref 19–32)
Chloride: 94 mEq/L — ABNORMAL LOW (ref 96–112)
Creatinine, Ser: 0.93 mg/dL (ref 0.4–1.2)
Glucose, Bld: 210 mg/dL — ABNORMAL HIGH (ref 70–99)
Potassium: 4.4 mEq/L (ref 3.5–5.1)

## 2011-01-30 LAB — LIPID PANEL
HDL: 44 mg/dL (ref >39)
Total CHOL/HDL Ratio: 5 RATIO
VLDL: UNDETERMINED mg/dL (ref 0–40)

## 2011-01-30 LAB — PHOSPHORUS: Phosphorus: 5.2 mg/dL — ABNORMAL HIGH (ref 2.3–4.6)

## 2011-01-31 LAB — GLUCOSE, CAPILLARY: Glucose-Capillary: 106 mg/dL — ABNORMAL HIGH (ref 70–99)

## 2011-01-31 LAB — BASIC METABOLIC PANEL
Calcium: 9.2 mg/dL (ref 8.4–10.5)
Creatinine, Ser: 0.77 mg/dL (ref 0.4–1.2)
GFR calc Af Amer: >60 mL/min (ref >60)
GFR calc non Af Amer: >60 mL/min (ref >60)
Sodium: 132 mEq/L — ABNORMAL LOW (ref 135–145)

## 2011-02-01 LAB — GLUCOSE, CAPILLARY
Glucose-Capillary: 98 mg/dL (ref 70–99)
Glucose-Capillary: 108 mg/dL — ABNORMAL HIGH (ref 70–99)

## 2011-02-01 LAB — BASIC METABOLIC PANEL
Chloride: 92 mEq/L — ABNORMAL LOW (ref 96–112)
Creatinine, Ser: 0.81 mg/dL (ref 0.4–1.2)
GFR calc Af Amer: >60 mL/min (ref >60)
GFR calc non Af Amer: >60 mL/min (ref >60)

## 2011-02-02 LAB — GLUCOSE, CAPILLARY
Glucose-Capillary: 106 mg/dL — ABNORMAL HIGH (ref 70–99)
Glucose-Capillary: 101 mg/dL — ABNORMAL HIGH (ref 70–99)
Glucose-Capillary: 162 mg/dL — ABNORMAL HIGH (ref 70–99)

## 2011-02-03 LAB — MAGNESIUM: Magnesium: 2.5 mg/dL (ref 1.5–2.5)

## 2011-02-03 LAB — PHOSPHORUS: Phosphorus: 4.7 mg/dL — ABNORMAL HIGH (ref 2.3–4.6)

## 2011-02-03 LAB — BASIC METABOLIC PANEL
Chloride: 97 mEq/L (ref 96–112)
GFR calc Af Amer: >60 mL/min (ref >60)
GFR calc non Af Amer: >60 mL/min (ref >60)
Potassium: 4.5 mEq/L (ref 3.5–5.1)
Sodium: 137 mEq/L (ref 135–145)

## 2011-02-03 LAB — GLUCOSE, CAPILLARY
Glucose-Capillary: 120 mg/dL — ABNORMAL HIGH (ref 70–99)
Glucose-Capillary: 105 mg/dL — ABNORMAL HIGH (ref 70–99)

## 2011-02-04 ENCOUNTER — Encounter: Payer: Self-pay | Admitting: Internal Medicine

## 2011-02-04 LAB — GLUCOSE, CAPILLARY
Glucose-Capillary: 127 mg/dL — ABNORMAL HIGH (ref 70–99)
Glucose-Capillary: 142 mg/dL — ABNORMAL HIGH (ref 70–99)

## 2011-02-08 NOTE — H&P (Signed)
NAMELAVONNA, Miles             ACCOUNT NO.:  [id number]  MEDICAL RECORD NO.:  [id number]           PATIENT TYPE:  E  LOCATION:  [facility]                         FACILITY:  [facility]  PHYSICIAN:  Baltazar Najjar, MD     DATE OF BIRTH:  Jun 10, 1941  DATE OF ADMISSION:  01/27/2011 DATE OF DISCHARGE:                             HISTORY & PHYSICAL   PRIMARY CARE PHYSICIAN:  Unassigned.  CODE STATUS:  DNI.  CHIEF COMPLAINT:  Shortness of breath.  HISTORY OF PRESENT ILLNESS:  Ms. [name] is a 70 year old Caucasian woman with history of systolic congestive heart failure, was last admitted to the hospital in March of this year, presented to the ER today with chief complaint of shortness of breath as per her it started early this morning, noted that the patient is a poor historian.  She has history of dementia and most of the history obtained with the help of her husband who was present at the time of the interview.  As per the patient's husband, the patient had sudden onset of shortness of breath which started early this morning; however, she had previous similar episodes in the past with shortness of breath.  She denies any associated chest pain.  She does admit that she has to use at least 2-3 pills to sleep at night and she stated that she got short of breath at rest and with activity.  As per husband, the patient has cough going on for about a month, dry, nonproductive, no associated fever.  There is no lower extremity edema or any other complaints.  In the ED, the patient was found to be hypoxemic requiring 3 liters of oxygen.  Her chest x-ray showed mild interstitial edema and we are asked to admit her for congestive heart failure exacerbation.  As per husband, the patient was used to be on home oxygen previously and she got off oxygen a year ago as per him because she felt better.  The patient also has not been seen on regular basis by a physician and the last time she was seen  by a physician as per her husband is more than a year ago.  PAST MEDICAL HISTORY: 1. Significant for systolic congestive heart failure.  Last 2-D     echocardiogram done on May 2010 that showed an EF of 35%. 2. Diabetes mellitus type 2. 3. History of hypertension. 4. Hyperlipidemia. 5. Dementia.  ALLERGIES:  Allergic to PENICILLIN.  HOME MEDICATIONS: 1. Lantus insulin 20 units daily at bedtime. 2. Aspirin 81 mg 1 tablet p.o. daily. 3. Pravastatin 10 mg p.o. daily. 4. Metformin 500 mg p.o. twice daily. 5. Lisinopril 10 mg p.o. daily. 6. Lasix 20 mg p.o. daily. 7. Coreg 6.25 mg 1 tablet p.o. daily with meals.  SOCIAL HISTORY:  Lives with her husband in [place].  No history of smoking or drinking.  FAMILY HISTORY:  Significant for coronary artery disease and hypertension in her brother.  REVIEW OF SYSTEMS:  As above in HPI.  Other systems reported negative. CHEST:  Significant for shortness of breath, cough.  CARDIOVASCULAR:  No chest pain.  Positive orthopnea.  GI:  No nausea, no vomiting.  No diarrhea or constipation.  No abdominal pain.  GU:  No dysuria.  No urinary frequency.  No flank pain, constipation.  No fever or chills.  PHYSICAL EXAMINATION:  VITAL SIGNS:  Blood pressure 143/87, pulse of 111, temperature 97.7, O2 sat 97% on 3 liters of oxygen. GENERAL:  She is alert, pleasantly confused in mild distress. NECK:  No JVD. LUNGS:  Bilateral bibasilar rails, no wheezing. CARDIOVASCULAR:  S1 and S2, regular rhythm and rate. ABDOMEN:  Soft, nontender. EXTREMITIES:  No pedal edema. NEUROLOGIC:  The patient is alert and she moves all her extremities.  No focal deficit appreciated.  LABORATORY DATA:  BNP is 333, potassium 4.1, sodium 133, BUN 21, creatinine 0.55.  Urinalysis showed trace leukocyte, 3-6 wbc's.  Cardiac enzymes showed troponin less than 0.05.  WBC is 9.3, hemoglobin 14, hematocrit 42.  RADIOLOGY:  Chest x-ray showed cardiomegaly with mild  interstitial edema or pulmonary vascular congestion suggesting early congestive heart failure.  ASSESSMENT AND PLAN: 1. Acute on chronic systolic congestive heart failure exacerbation. 2. Mild hyponatremia probably secondary to acute on chronic congestive     heart failure exacerbation. 3. History of diabetes mellitus type 2. 4. Hypertension. 5. History of dyslipidemia.  PLAN: 1. Admit to telemetry floor. 2. Cycle cardiac enzymes. 3. We will obtain 2-D echocardiogram to reassess EF and left     ventricular function. 4. Diuresis with IV Lasix, continue Coreg and lisinopril. 5. Daily weights, strict input and output, heart healthy diet. 6. Rest of orders as per heart failure core order set. 7. Diabetes mellitus type 2.  Continue with Lantus insulin and cover     with insulin sliding scale and adjust as needed for optimal     control.  Her last hemoglobin A1c from March 2012 was 12.7, which     is out of control.  She will probably need further adjustment of     her insulin dose during this hospitalization. 8. Hypertension.  Continue with lisinopril and Coreg and adjust as     needed for optimal control. 9. Dyslipidemia.  Continue with statins. 10.Code status.  The patient is DNI. 11.The patient will need to be assigned to a PCP prior to discharge.          ______________________________ Baltazar Najjar, MD     SA/MEDQ  D:  01/27/2011  T:  01/27/2011  Job:  [id number]  Electronically Signed by Hannah Beat MD on 02/08/2011 08:15:43 PM

## 2011-02-11 ENCOUNTER — Encounter: Payer: Self-pay | Admitting: Internal Medicine

## 2011-02-13 NOTE — Consult Note (Signed)
NAME:  [name], [name]             ACCOUNT NO.:  [id number]  MEDICAL RECORD NO.:  [id number]           PATIENT TYPE:  I  LOCATION:  4735                         FACILITY:  [facility]  PHYSICIAN:  Curlee Bogan C. Omarie Parcell, MD, FACCDATE OF BIRTH:  [birth year]  DATE OF CONSULTATION: DATE OF DISCHARGE:                                CONSULTATION   I was asked by Dr. Marthann Schiller to consult on [name], 70 year old white female, who was admitted on April 16 with progressive shortness of breath.  She was found to be in congestive heart failure with an elevated BNP and chest x-ray being consistent with heart failure.  She remarkably had no lower extremity edema.  She has a history of congestive heart failure with her last ejection fraction in May 2010 being 35%.  We do not have any records from [facility], which is where she has received her care in the past.  She is too demented to give me any clear story on specifics. However, 2-D echocardiogram today shows an inferior apical akinetic area consistent with ischemic cardiomyopathy.  Ejection fraction is now 25%- 30%.  She has probably moderate aortic stenosis.  PAST MEDICAL HISTORY: 1. Type 2 diabetes. 2. History of hypertension. 3. Hyperlipidemia. 4. Dementia.  ALLERGIES:  PENICILLIN.  HOME MEDICATIONS: 1. Lantus insulin 20 units daily at bedtime. 2. Aspirin 81 mg a day. 3. Pravastatin 10 mg per day. 4. Metformin 500 mg p.o. b.i.d. 5. Lisinopril 10 mg per day. 6. Lasix 20 mg a day. 7. Coreg 6.25 mg one p.o. daily.  I have just reviewed medicine reconciliation and she is taking carvedilol 6.25 b.i.d.  SOCIAL HISTORY:  She is at home, being cared for by her family in [place].  She does not smoke or drink.  She has significant dementia.  No family is present at the present time.  FAMILY HISTORY:  Significant for coronary artery disease as well as hypertension.  REVIEW OF SYSTEMS:  Really  unremarkable.  PHYSICAL EXAMINATION:  GENERAL:  Her exam this evening shows her to be a very pleasant lady, in no acute distress.  She is lying down without any sign of respiratory distress.  She is overweight, slightly pale, in no acute distress. VITAL SIGNS:  Blood pressure is 137/77, her pulse is 79 and regular, her sats is 95% on 2 liters, temperature is 97.1, respirations 20 and unlabored.  Her weight suggests that this is going up since admission, though looking at her I's and O's, she has been profoundly negative 2035 on April 16, negative 760 on April 17, negative 1245 today.  She had been getting intravenous Lasix. NECK:  Shows no obvious JVD. LUNGS:  Reveal bibasilar rales.  No wheezing or rhonchi. CARDIOVASCULAR:  PMI was difficult to appreciate.  She has normal S1-S2. No gallop.  She has a systolic murmur and her S2 does split. ABDOMEN:  Soft and nontender. EXTREMITIES:  No pedal edema. NEUROLOGIC:  She is alert, very pleasant, but a little confused.  No focal deficits noted.  LABORATORY DATA:  Has been reviewed.  Her BUN and creatinine have increased since admission from 21  over 0.55 to her last being 28 and 0.81.  Sodium is increased since admission with diuresis.  ASSESSMENT: 1. Acute on chronic systolic congestive heart failure, improving with     diuresis. 2. Hyponatremia, now corrected with Lasix and increase free water     excretion. 3. Probable ischemic cardiomyopathy with inferior apical akinetic area     on her echo. 4. Moderate aortic stenosis. 5. Hypertension. 6. History of dyslipidemia. 7. Dementia.  PLAN: 1. I would change her to p.o. Lasix to see how she is doing, but now     would be a little more gentle with 40 mg p.o. b.i.d. 2. Continue follow I's and O's and BMETs. 3. Discontinue Foley.  She has multiple species growing and she has     cloudy urine and I am concerned she will develop a full fledged     urinary tract infection. 4. The patient  will need discharge follow-up within a week with     Cardiology.  We will schedule through our office.  She has no     primary care physician that I can find in the chart and has not     seen a cardiologist in [place] in over a year.  Thank you very much for consultation.     Libni Fusaro C. Daleen Squibb, MD, [facility]     TCW/MEDQ  D:  01/29/2011  T:  01/30/2011  Job:  [id number]  Electronically Signed by Valera Castle MD [facility] on 02/13/2011 08:19:27 AM

## 2011-02-19 NOTE — Discharge Summary (Signed)
[name], Miles             ACCOUNT NO.:  [id number]  MEDICAL RECORD NO.:  [id number]           PATIENT TYPE:  I  LOCATION:  4735                         FACILITY:  [facility]  PHYSICIAN:  Altha Harm, MDDATE OF BIRTH:  02-Jun-1941  DATE OF ADMISSION:  01/27/2011 DATE OF DISCHARGE:  02/04/2011                              DISCHARGE SUMMARY   DISCHARGE DISPOSITION:  Skilled facility for short-term rehab.  FINAL DISCHARGE DIAGNOSES: 1. Acute on chronic systolic heart failure, systolic component     resolved. 2. Cardiomyopathy, likely ischemic present on admission, ejection     fraction of 25% to 30%. 3. Diabetes type 2, uncontrolled. 4. Hyperlipidemia. 5. Hypertension, well controlled. 6. Dementia. 7. Hyponatremia, resolved. 8. Hypokalemia, resolved. 9. Acute-on-chronic diastolic dysfunction, acute component resolved.  DISCHARGE MEDICATIONS: 1. Acetaminophen 650 mg p.o. q.4 h. p.r.n. pain. 2. Carvedilol 3.125 mg p.o. b.i.d. with meals. 3. Digoxin 0.125 mg p.o. daily. 4. Furosemide 40 mg p.o. b.i.d. 5. Glimepiride 2 mg p.o. daily before breakfast. 6. Lantus 18 units subcutaneously at bedtime. 7. Lisinopril 2.5 mg p.o. daily. 8. Metformin 1000 mg p.o. b.i.d. with meals. 9. MiraLax 17 g in 8 ounces of water daily as needed for constipation. 10.Aspirin enteric-coated 81 mg p.o. daily. 11.Pravachol 10 mg p.o. daily.  CONSULTANTS:  [facility].  PROCEDURES:  None.  DIAGNOSTIC STUDIES: 1. CT echocardiogram done on January 28, 2011, which shows systolic     function severely reduced with ejection fraction of 25% to 30%.     There is global hypokinesis and severe insular apical hypokinesis     to akinesis.  There is a calcified false tendon at the apex of the     LV.  No thrombus identified.  The patient also has Doppler     parameters consistent with grade 1 diastolic dysfunction. 2. Portable chest x-ray done on admission, which shows cardiomegaly     without  mild interstitial edema or pulmonary vascular congestion     suggesting early congestive heart failure, or atherosclerosis. 3. Two-view chest x-ray done on the subsequent day which shows     improved pulmonary edema.  CODE STATUS:  Full code.  PRIMARY CARE PHYSICIAN:  Unassigned.  ALLERGIES:  PENICILLIN.  CHIEF COMPLAINT:  Shortness of breath.  HISTORY OF PRESENT ILLNESS:  Please refer to the H and P dictated by Dr. Baltazar Najjar for details of the HPI.  However, in short this is a 70 year old lady with known systolic congestive heart failure who has had very poor long-term followup.  The patient presented to the emergency room with complaints of shortness of breath.  The patient was referred to [facility] for further evaluation and management.  HOSPITAL COURSE: 1. Acute on chronic systolic diastolic dysfunction.  The patient is     known to have chronic systolic dysfunction with an ejection     fraction of approximately 20%.  The patient presents with fluid     volume overload and decompensated or acute on chronic systolic     heart failure.  The patient was diuresed during the hospital     course.  She improved clinically with  a decrease in weight and     negative fluid balance.  Owing to the fact that the patient has     such poor systolic dysfunction and has had such poor followup as an     outpatient, I asked Cardiology to see her.  They made adjustments     to her medication decreasing her ACE inhibitor and her beta-blocker     to allow for an improved blood pressure and further diuresis.  Her     Lasix was increased and the patient was placed on digoxin     intervention during this hospitalization.  Presently, the patient     is well compensated.  She is not requiring any oxygen.  She is able     to participate in therapy without having any significant shortness     of breath.  The patient is being discharged to skilled facility for     continued restorative  rehabilitation and the ultimate goal is to     get her back to her home setting. 2. Diabetes type 2.  The patient's diabetes was uncontrolled as     reflected and a hemoglobin A1c of 11.7.  Adjustments were made to     her medication including an increase in her metformin to 1000 mg     b.i.d. and addition of glimepiride.  With those interventions, the     patient's blood sugars have been ranging in the 120s-160s.  I would     recommend the patient have her blood sugars checked on a before     meals and nightly basis and sliding scale instituted if necessary     by the supervising physician. 3. Cardiomyopathy.  The patient has a cardiomyopathy dating back to     2007.  It is felt that this is likely an ischemic cardiomyopathy;     however, we do not have data to absolutely prove this.     Nevertheless, this cardiomyopathy has led to chronic systolic heart     failure and as noted above the patient was admitted at this time     with acute on chronic heart failure.  The patient is on lisinopril     at a lower dose for better diuresis.  In addition, she is on Coreg     beta-blocker and will continue on that. 4. Hyperlipidemia.  She has continued on her anti-lipid medication. 5. In terms of her hypertension, the patient's blood pressures were     well-controlled with the cardiac regimen in which she has been     placed.  She has Lasix, Coreg, and lisinopril.  Her blood pressures     have been ranging in the 120s, sometimes dropping to the high 90s     when she has immediately received her medications; however, she     recovers well throughout the day and has shown no evidence of     orthostasis. 6. Hyponatremia, resolved with diuresis. 7. Hypokalemia, resolved with replacement.  PHYSICAL EXAMINATION: 1. Acute on chronic systolic diastolic dysfunction.  The patient is     known to have chronic systolic dysfunction with an ejection     fraction of approximately 20%.  The patient presents  with fluid     volume overload and decompensated or acute on chronic systolic     heart failure.  The patient was diuresed during the hospital     course.  She improved clinically with a decrease in weight and  negative fluid balance.  Owing to the fact that the patient has     such poor systolic dysfunction and has had such poor followup as an     outpatient, I asked Cardiology to see her.  They made adjustments     to her medication decreasing her ACE inhibitor and her beta-blocker     to allow for an improved blood pressure and further diuresis.  Her     Lasix was increased and the patient was placed on digoxin     intervention during this hospitalization.  Presently, the patient     is well compensated.  She is not requiring any oxygen.  She is able     to participate in therapy without having any significant shortness     of breath.  The patient is being discharged to skilled facility for     continued restorative rehabilitation and the ultimate goal is to     get her back to her home setting. 2. Diabetes type 2.  The patient's diabetes was uncontrolled as     reflected and a hemoglobin A1c of 11.7.  Adjustments were made to     her medication including an increase in her metformin to 1000 mg     b.i.d. and addition of glimepiride.  With those interventions, the     patient's blood sugars have been ranging in the 120s-160s.  I would     recommend the patient have her blood sugars checked on a before     meals and nightly basis and sliding scale instituted if necessary     by the supervising physician. 3. Cardiomyopathy.  The patient has a cardiomyopathy dating back to     2007.  It is felt that this is likely an ischemic cardiomyopathy;     however, we do not have data to absolutely prove this.     Nevertheless, this cardiomyopathy has led to chronic systolic heart     failure and as noted above the patient was admitted at this time     with acute on chronic heart failure.  The  patient is on lisinopril     at a lower dose for better diuresis.  In addition, she is on Coreg     beta-blocker and will continue on that. 4. Hyperlipidemia.  She has continued on her anti-lipid medication. 5. In terms of her hypertension, the patient's blood pressures were     well-controlled with the cardiac regimen in which she has been     placed.  She has Lasix, Coreg, and lisinopril.  Her blood pressures     have been ranging in the 120s, sometimes dropping to the high 90s     when she has immediately received her medications; however, she     recovers well throughout the day and has shown no evidence of     orthostasis. 6. Hyponatremia, resolved with diuresis. 7. Hypokalemia, resolved with replacement.  PHYSICAL EXAMINATION:  GENERAL:  At the time of discharge the patient is stable. VITAL SIGNS:  Temperature is 97.7, heart rate 78, blood pressure 128/69, respiratory rate 16, O2 sats are 96% on room air. HEENT:  She is normocephalic and atraumatic.  Pupils are equal round and reactive to light and accommodation.  Extraocular movements are intact. Oropharynx is moist.  No exudate, erythema, or lesions are noted. NECK:  Trachea is midline.  No masses, no thyromegaly, no JVD, no carotid bruits. LUNGS:  Clear to auscultation.  No wheezing or rhonchi  noted. CARDIOVASCULAR:  She has got normal S1 and S2.  No murmurs, rubs, or gallops are noted. ABDOMEN:  Obese, soft, nontender, nondistended.  No masses, no hepatosplenomegaly. EXTREMITIES:  No clubbing, cyanosis, or edema. NEUROLOGICAL:  She has no focal neurological deficits. PSYCHIATRIC:  She is alert and oriented x3.  The patient does have some mild cognitive deficits; however, she is able to answer questions relatively accurately.  DIETARY RESTRICTIONS:  The patient should be on a heart-healthy diabetic diet.  Physical restrictions under the supervision of Physical Therapy.  FOLLOWUP:  The patient is to follow up with the  supervising physician at the skilled facility within 3 days and she is to follow up with [facility] in 4-6 weeks.  Total time for this discharge process including face-to-face time 40 minutes.     Altha Harm, MD     MAM/MEDQ  D:  02/04/2011  T:  02/04/2011  Job:  [id number]  Electronically Signed by Marthann Schiller MD on 02/19/2011 02:09:25 PM

## 2011-02-25 NOTE — Discharge Summary (Signed)
[name], [name]             ACCOUNT NO.:  [id number]   MEDICAL RECORD NO.:  [id number]          PATIENT TYPE:  INP   LOCATION:  5504                         FACILITY:  [facility]   PHYSICIAN:  Peggye Pitt, M.D. DATE OF BIRTH:  [birth year]   DATE OF ADMISSION:  03/08/2009  DATE OF DISCHARGE:  03/10/2009                               DISCHARGE SUMMARY   DISCHARGE DIAGNOSES:  1. Chronic systolic congestive heart failure with an ejection fraction      of 35%.  2. Hyponatremia, resolved.  3. Hypertension.  4. Insulin-dependent diabetes mellitus.  5. History of cerebrovascular accident.   DISCHARGE MEDICATIONS:  1. Aspirin 81 mg daily.  2. Coreg 6.25 mg twice daily.  3. Lisinopril 5 mg daily.  4. Zocor 10 mg at bedtime.  5. Insulin 25 units injected subcutaneously at bedtime.  6. NovoLog sliding scale insulin as follows:  She has been instructed      to check her CBGs right before she eats and to cover as follows:      If her CBGs between 121 and 150 two units, between 151 and 200      three units, between 201 and 250 five units, between 251 and 300      eight units, between 301 and 350 eleven units, and greater than 351      fifteen units.   DISPOSITION AND FOLLOWUP:  Ms. [name] is discharged home in stable  condition.  Her new primary care physician will be Dr. Della Goo.   CONSULTATION THIS HOSPITALIZATION:  None.   IMAGES AND PROCEDURES THIS HOSPITALIZATION:  1. A chest x-ray on Mar 07, 2009, that showed cardiomegaly and no      acute abnormalities.  2. A CT angiogram of the chest on Mar 08, 2009, that was negative for      PE with marked cardiomegaly, extensive atherosclerosis, and an      aberrant right subclavian artery.  3. The patient also had a 2-D echocardiogram on Mar 09, 2009, that      showed an ejection fraction of 35% with moderate left ventricular      hypertrophy.  Wall thickness was increased.   HISTORY AND PHYSICAL EXAM:  For full details,  please see dictation by  Dr. Ninfa Linden on Mar 08, 2009, but in brief Ms. [name] is a pleasant 70-year-  old Caucasian lady who came in with shortness of breath.  She had ran  out of her medications because she was unable to afford them and also  because she lacked a primary care physician to prescribe her  medications.  For that reason, she was admitted for further evaluation  and management.   HOSPITAL COURSE:  1. Chronic systolic congestive heart failure.  A repeat echocardiogram      in the hospital has showed an ejection fraction of 35%.  She is on      adequate medical treatment including a beta-blocker and ACE      inhibitor and statin and aspirin.  2. Hyponatremia with an initial sodium of 128 which has resolved on      day of  discharge to 136 with just IV fluids.  3. Hypertension.  It has been well controlled this hospitalization.      In fact, her blood pressure has been a little bit low with      systolics in the 90s prompting a decrease in the doses of her Coreg      and lisinopril.  4. Insulin-dependent diabetes mellitus.  She has been maintained on      Lantus and NovoLog insulin while in the hospital.  Her CBGs      remained elevated.  We have increased her Lantus to 25 units at      bedtime.  Further titration of her insulin can be accomplished in      the outpatient setting.  5. Rest of chronic medical issues have not been a problem this      hospitalization.   DISCHARGE VITAL SIGNS:  Blood pressure 99/58, heart rate 91,  respirations 20, and O2 sats 96% on room air with a temperature of 98.6.   DISCHARGE LABORATORY DATA:  Sodium 136, potassium 4.5, chloride 102,  bicarb 29, BUN 19, and creatinine 0.69 with a glucose of 221.  WBC 6.0,  hemoglobin 14.1, and a platelet count of 180.      Peggye Pitt, M.D.  Electronically Signed     EH/MEDQ  D:  03/10/2009  T:  03/11/2009  Job:  [id number]   cc:   Della Goo, M.D.

## 2011-02-25 NOTE — H&P (Signed)
[name], [name]             ACCOUNT NO.:  [id number]   MEDICAL RECORD NO.:  [id number]          PATIENT TYPE:  INP   LOCATION:  5504                         FACILITY:  [facility]   PHYSICIAN:  Oswald Hillock, MD        DATE OF BIRTH:  07-28-1941   DATE OF ADMISSION:  03/07/2009  DATE OF DISCHARGE:                              HISTORY & PHYSICAL   CHIEF COMPLAINT:  Respiratory distress.   HISTORY OF PRESENT ILLNESS:  The patient is a 70 year old Caucasian  female with history of multiple comorbidities including diabetes  mellitus, congestive heart failure, hypertension and a CVA who presents  to the ER with complaints of progressively worsening shortness of breath  that began about 3 days back.  The patient apparently ran out of her  medications about 3-4 weeks ago and has not had any since.  She denies  any chest pain, dizziness, loss of consciousness or any focal weakness  of any part of the body though she does complain of some numbness in her  toes for the last 2 weeks.  She denies any fever, rigors, chills, cough  or any urinary symptoms.   PAST MEDICAL HISTORY:  Significant for:  1. Hypertension.  2. Congestive heart failure.  3. History of CVA.  4. History of CHF.  5. History of diabetes mellitus.  6. History of hyponatremia in the past.   PAST SURGICAL HISTORY:  None.   CURRENT MEDICATIONS:  The patient is currently taking no medications  though she does remember being on NovoLog, Lantus and Coreg in addition  to lisinopril dosage unknown.   ALLERGIES:  PENICILLIN.   FAMILY HISTORY:  Significant for hypertension and coronary artery  disease.   SOCIAL HISTORY:  The patient denies any alcohol, tobacco or drug use.  States that she is independent in all her ADLs and has been noncompliant  with her medications.   REVIEW OF SYSTEMS:  An extensive review of system was done and all  systems are negative except for the positives mentioned in history of  present  illness.   PHYSICAL EXAMINATION:  VITALS:  On admission pulse of 129, blood  pressure 172/105, respiratory rate of 22, O2 sats of 93% on room air  initially and then it dropped to 89% on room air.  GENERAL:  The patient is conscious, alert, oriented to time, place and  person in mild-to-moderate respiratory distress.  HEENT:  No scleral icterus.  No pallor.  Ears negative.  Poor dental  hygiene.  NECK:  Supple.  No lymphadenopathy.  No JVD.  No carotid bruits.  CHEST:  Breath sounds heard bilaterally.  Fair air entry.  Occasional  rhonchi.  Diminished breath sounds at bases.  CV:  S1 and S2 plus regular tachycardia.  No gallop, rub or murmur  appreciated.  ABDOMEN:  Soft, obese, nontender.  Bowel sounds present.  EXTREMITIES: No cyanosis, clubbing or edema.  NEUROLOGIC:  Cranial nerves II-XII are grossly intact.  The patient  moves all four extremities.   LABORATORY DATA:  WBC count 9, hemoglobin 16.4, hematocrit 48.1,  platelet count of 216.  PT 12.5, INR 0.19.  Her D-dimer was 0.27, CK was  39.3, MB 1.1, troponin less than 0.05.  Her chest x-ray showed  cardiomegaly.  Her EKG showed sinus tachycardia, left axis deviation,  nonspecific ST changes.   IMPRESSION AND PLAN:  This is a case of a 70 year old Caucasian female  with multiple comorbidities and history of noncompliance with  medications who presents with respiratory distress.  1. Respiratory distress, etiology unclear at this time.  Her BNP is      essentially within normal limits.  Her chest x-ray does not show      any acute findings and her clinical examination is not consistent      with congestive heart failure exacerbation either.  She is      hypoxemic and tachycardic and even though her D-dimer is within      normal limits, we will get a CT of her chest to rule out a      pulmonary embolus.  For now, the patient will be put on oxygen      supplementation and we will monitor her closely.  We will also      recycle  her enzymes and follow up with the results.  2. She is currently on an inch of Nitropaste and we will continue that      as she may have unstable angina.  3. Diabetes mellitus uncontrolled.  We will give her a normal saline      bolus of 500 mL for now, put her on sliding-scale insulin coverage      with regular insulin sensitive scale.  4. Hypertension.  We will resume her lisinopril 10 mg daily, restart      her Coreg at 12.5 mg b.i.d. and get an updated medication list from      her primary care physician's office  5. History of cerebrovascular accident.  Continue aspirin.  6. History of hyperlipidemia.  Check fasting lipid panel and continue      Zocor 10 mg daily.  7. Deep vein thrombosis/gastrointestinal prophylaxis Protonix and      Lovenox.   CODE STATUS:  Full code.      Oswald Hillock, MD  Electronically Signed     BA/MEDQ  D:  03/08/2009  T:  03/08/2009  Job:  [id number]

## 2011-02-28 NOTE — H&P (Signed)
NAME:  [name], [name]             ACCOUNT NO.:  [id number]   MEDICAL RECORD NO.:  [id number]          PATIENT TYPE:  INP   LOCATION:  1828                         FACILITY:  [facility]   PHYSICIAN:  Kela Millin, M.D.DATE OF BIRTH:  Jul 22, 1941   DATE OF ADMISSION:  11/05/2005  DATE OF DISCHARGE:                                HISTORY & PHYSICAL   PRIMARY CARE PHYSICIAN:  Unassigned.   CHIEF COMPLAINT:  Worsening shortness of breath.   HISTORY OF PRESENT ILLNESS:  The patient is a 70 year old white female with  past medical history significant for congestive heart failure, diabetes  mellitus, past history of CVA who presents with complaints of shortness of  breath that has worsened over the past week. She admits to a longstanding  two pillow orthopnea that is unchanged. She denies PND. She has had  worsening leg swelling for the past one to two days. She denies chest pain,  nausea, vomiting. She admits to a slight nonproductive cough. She also  admits to polydipsia, polyuria for the past few months. She states she has  not taken her medications for the past several months and has not seen her  primary care physician, Dr. Tanya Nones, in about a year.   The patient was seen in the ER and chest x-ray revealed interstitial edema  and asymmetric airspace disease on the right. Her B-type natriuretic peptide  was 224, her blood sugar was elevated and she was also found to be febrile.  She was admitted to the [facility] for further evaluation and  management.   PAST MEDICAL HISTORY:  As stated above.   MEDICATIONS:  None for the past several months. She states that she was  previously on insulin but she does not remember what kind of insulin.   ALLERGIES:  PENICILLIN.   SOCIAL HISTORY:  She quit tobacco two years ago. Denies alcohol.   FAMILY HISTORY:  Her mother had diabetes mellitus.   REVIEW OF SYSTEMS:  As per HPI. Other review of systems negative.   PHYSICAL  EXAMINATION:  GENERAL:  The patient is an obese, elderly white  female in no respiratory distress.  VITAL SIGNS:  Her blood pressure was initially 186/108, currently 157/76  (after IV Lasix). Temperature 100.8 and her pulse 111. Her O2 saturation is  98%, initially 89%.  HEENT:  PERRL. EOMI. Sclerae anicteric. Moist mucus membranes. No oral  exudates.  NECK:  Supple. No adenopathy. No JVD. No carotid bruits appreciated.  LUNGS:  A few basilar crackles. Moderate air movement. No wheezes  appreciated.  CARDIOVASCULAR:  Tachycardic. Normal S1 and S2. Regular rhythm. No S3  appreciated.  ABDOMEN:  Obese. Bowel sounds present. Nontender and nondistended. No  organomegaly.  EXTREMITIES:  There is +2 edema. No cyanosis.  NEUROLOGICAL:  Alert and oriented x3. Cranial nerves II through XII grossly  intact. Nonfocal exam.   LABORATORY DATA:  The chest x-ray shows cardiomegaly and interstitial edema.  There is also asymmetric airspace disease on the right, questionable focal  edema versus pneumonia. Her B-type natriuretic peptide is 224. Point of care  markers negative x1. Her  sodium is 130 with a potassium of 3.9. Chloride is  102, BUN 11, glucose 372. A pH 7.55, pCO2 29.9, bicarbonate is 26.1.  Hematocrit is 48, hemoglobin 16.3, white cell count is 9.8, and platelet  count is 262,000. Neutrophil count 77%.   ASSESSMENT/PLAN:  1.  Congestive heart failure exacerbation:  Likely secondary to      noncompliance. We will check cardiac enzymes, a 2-D echocardiogram.      Strict ins and outs and daily weights. We will diurese the patient with      intravenous Lasix and add ACE inhibitors, nitrates and aspirin. Follow      and consider cardiology consult as appropriate pending cardiac enzymes.  2.  Uncontrolled hypertension:  Monitor blood sugars and ACE inhibitor as      above. Cardiac enzymes as above.  3.  Probable pneumonia:  X-ray as above. The patient also febrile. We will      obtain blood  cultures, empiric antibiotics and follow.  4.  Uncontrolled diabetes mellitus:  Accu-Chek monitoring, sliding scale      insulin and Lantus. We will check a hemoglobin A1c as well as a fasting      lipid profile and thyroid-stimulating hormones.  5.  Hyponatremia likely secondary to congestive heart failure and      hyperglycemia as above:  Follow and recheck.  6.  History of cerebrovascular accident.      Kela Millin, M.D.  Electronically Signed     ACV/MEDQ  D:  11/06/2005  T:  11/06/2005  Job:  [id number]   cc:   Broadus John T. Pamalee Leyden, MD  Fax: [phone]

## 2011-02-28 NOTE — Discharge Summary (Signed)
[name]             ACCOUNT NO.:  [id number]   MEDICAL RECORD NO.:  [id number]          PATIENT TYPE:  INP   LOCATION:  2037                         FACILITY:  [facility]   PHYSICIAN:  Sherin Quarry, MD      DATE OF BIRTH:  Apr 22, 1941   DATE OF ADMISSION:  11/05/2005  DATE OF DISCHARGE:                                 DISCHARGE SUMMARY   [name] is a 70 year old lady whose past medical history is  remarkable for previously-diagnosed congestive heart failure, diabetes, and  past history of stroke. She presented to [facility] on November 05, 2005, with a history of two-pillow orthopnea, progressively-worsening  dyspnea on exertion, and progressive leg swelling. The patient had not seen  her previous primary doctor for 1 year prior to presentation and had not  taken any medications for approximately 6 months. On arrival to the ER, a  chest x-ray was obtained which confirmed the diagnosis of congestive heart  failure. Concern was raised about possible increased infiltrate at the right  base which may represent a superimposed pneumonia. Her BNP was 224. She was  noted to have a glucose of 372. In light of uncontrolled diabetes and  findings of congestive heart failure, she was admitted to the [facility]. Physical exam at the time of admission revealed an  obese woman who was in mild respiratory distress. Her blood pressure  initially was 186/108. After receiving intravenous Lasix it was 157/76. Her  temperature was 100.8, pulse was 111, O2 saturation was 89% on room air.  HEENT exam was within normal limits. Examination of the chest revealed few  bibasilar crackles. Cardiovascular exam revealed normal S1 and S2. There  were no rubs or gallops. The abdomen was benign. On neurologic testing the  patient was oriented x3. Motor, sensory, and cerebellar testing was normal.  Examination of extremities revealed 2+ edema. Subsequently, serial cardiac  enzymes were negative. Other relevant laboratory studies obtained included  LDL cholesterol of 139, serial cardiac enzymes which were negative, an A1c  hemoglobin of 14.3, creatinine of 0.6, AST of 36, ALT of 60. The CBC  revealed hemoglobin of 15, white count was 9800.   On admission, the patient was begun on a sliding scale insulin regimen as  well as supplemental Lantus insulin, initially 10 units at bedtime. She was  initially placed on Lasix 40 mg IV every 12 hours. She was placed on Avelox  400 mg IV daily because of concern about possible right lower lobe  pneumonia. Lisinopril 20 mg daily was also initiated. Lovenox protocol was  begun as well. Over time the patient experienced marked improvement in her  overall status. An echocardiogram was obtained which revealed a left  ventricular ejection fraction of 20%. I discussed the patient's diabetes  management with her further and she indicated that her previous physician  had initially tried to manage her diabetes with oral agents. This had not  been effective and up until about 6 months ago she had been taking insulin  on a twice-daily schedule. She was not certain what type of insulin she  had  been receiving or in what dose. It was her impression that this approach had  been effective. By January 27 the patient was feeling entirely well. She had  no orthopnea, no PND, ankle edema had resolved. Her blood pressure was  120/70. Her glucose on January 27 a.m. was 198. Mrs. Dry and I had a  discussion in which I emphasized to her that her medical problems were of  the type that were best managed by frequent outpatient visits and that all  of her medicines would require careful adjustment in the future. She and her  husband reviewed their insurance coverage and decided that she would follow  up with Dr. Jarome Matin at [facility]. I am therefore  sending a copy of this dictation to Dr. Eloise Harman and his partners.    DISCHARGE DIAGNOSES:  1.  Congestive heart failure with echocardiogram revealing diminished      ejection fraction.  2.  Uncontrolled hypertension.  3.  Probable pneumonia.  4.  Uncontrolled diabetes.  5.  History of stroke.   DISCHARGE MEDICATIONS:  1.  Insulin 75/25 30 units in the morning before breakfast, 20 units in the      evening before supper.  2.  Aspirin 81 mg daily.  3.  K-Dur 20 mEq b.i.d.  4.  Lasix, the dose of which has been gradually decreased to 20 mg daily.  5.  Avelox 400 mg daily which will be discontinued on November 12, 2005.  6.  Lisinopril 10 mg daily.   The patient needs to monitor her blood sugars in the morning before  breakfast, in the evening before supper, and keep a record of these for Dr.  Eloise Harman and his partners. I advised her strongly to call and arrange an  appointment on Monday.   CONDITION AT THE TIME OF DISCHARGE:  Fair.           ______________________________  Sherin Quarry, MD     SY/MEDQ  D:  11/08/2005  T:  11/08/2005  Job:  [id number]   cc:   Barry Dienes. Eloise Harman, M.D.  Fax: [phone]

## 2011-03-04 ENCOUNTER — Encounter: Payer: Medicare PPO | Admitting: Cardiovascular Disease

## 2011-03-04 ENCOUNTER — Telehealth: Payer: Self-pay | Admitting: Cardiovascular Disease

## 2011-03-04 NOTE — Telephone Encounter (Signed)
Called pt in regards to missed appt with [facility] on 03/04/11.  There was no answer at the house and there was not a voicemail system.

## 2011-03-05 NOTE — Telephone Encounter (Signed)
Maybe resend a letter?

## 2011-08-04 ENCOUNTER — Inpatient Hospital Stay: Payer: Self-pay | Admitting: Internal Medicine

## 2011-08-13 ENCOUNTER — Ambulatory Visit: Payer: Medicare PPO | Admitting: Internal Medicine

## 2011-08-13 DIAGNOSIS — Z0289 Encounter for other administrative examinations: Secondary | ICD-10-CM

## 2011-10-14 DIAGNOSIS — A0472 Enterocolitis due to Clostridium difficile, not specified as recurrent: Secondary | ICD-10-CM

## 2011-10-14 HISTORY — DX: Enterocolitis due to Clostridium difficile, not specified as recurrent: A04.72

## 2011-11-17 ENCOUNTER — Encounter: Payer: Self-pay | Admitting: Cardiology

## 2011-11-17 ENCOUNTER — Inpatient Hospital Stay: Payer: Self-pay | Admitting: Internal Medicine

## 2011-11-17 DIAGNOSIS — R0602 Shortness of breath: Secondary | ICD-10-CM

## 2011-11-17 LAB — CBC
HCT: 38.9 % (ref 35.0–47.0)
MCH: 28.9 pg (ref 26.0–34.0)
MCHC: 32.9 g/dL (ref 32.0–36.0)
MCV: 88 fL (ref 80–100)
Platelet: 247 10*3/uL (ref 150–440)
RBC: 4.44 10*6/uL (ref 3.80–5.20)
WBC: 12.1 10*3/uL — ABNORMAL HIGH (ref 3.6–11.0)

## 2011-11-17 LAB — COMPREHENSIVE METABOLIC PANEL
Alkaline Phosphatase: 148 U/L — ABNORMAL HIGH (ref 50–136)
Bilirubin,Total: 0.9 mg/dL (ref 0.2–1.0)
Chloride: 99 mmol/L (ref 98–107)
Co2: 28 mmol/L (ref 21–32)
Creatinine: 0.45 mg/dL — ABNORMAL LOW (ref 0.60–1.30)
Osmolality: 292 (ref 275–301)
Sodium: 139 mmol/L (ref 136–145)
Total Protein: 7.1 g/dL (ref 6.4–8.2)

## 2011-11-17 LAB — CK TOTAL AND CKMB (NOT AT ARMC)
CK, Total: 50 U/L (ref 21–215)
CK, Total: 45 U/L (ref 21–215)
CK-MB: 0.7 ng/mL (ref 0.5–3.6)

## 2011-11-17 LAB — URINALYSIS, COMPLETE
Bilirubin,UR: NEGATIVE
Ph: 5 (ref 4.5–8.0)
Protein: 100
RBC,UR: 6 /HPF (ref 0–5)
Specific Gravity: 1.021 (ref 1.003–1.030)
Squamous Epithelial: <1

## 2011-11-17 LAB — PROTIME-INR: INR: 1

## 2011-11-17 LAB — TROPONIN I
Troponin-I: 0.07 ng/mL — ABNORMAL HIGH
Troponin-I: 0.08 ng/mL — ABNORMAL HIGH

## 2011-11-17 LAB — MAGNESIUM: Magnesium: 1.7 mg/dL — ABNORMAL LOW

## 2011-11-18 ENCOUNTER — Encounter: Payer: Self-pay | Admitting: Cardiology

## 2011-11-18 DIAGNOSIS — I5021 Acute systolic (congestive) heart failure: Secondary | ICD-10-CM

## 2011-11-18 LAB — CBC WITH DIFFERENTIAL/PLATELET
Basophil #: 0 10*3/uL (ref 0.0–0.1)
Basophil %: 0.5 %
Eosinophil #: 0.1 10*3/uL (ref 0.0–0.7)
HCT: 34.7 % — ABNORMAL LOW (ref 35.0–47.0)
HGB: 11.2 g/dL — ABNORMAL LOW (ref 12.0–16.0)
Lymphocyte #: 1.5 10*3/uL (ref 1.0–3.6)
Lymphocyte %: 18.3 %
MCHC: 32.4 g/dL (ref 32.0–36.0)
MCV: 88 fL (ref 80–100)
Monocyte #: 0.6 10*3/uL (ref 0.0–0.7)
Neutrophil #: 5.9 10*3/uL (ref 1.4–6.5)
Neutrophil %: 72 %
RDW: 13.8 % (ref 11.5–14.5)

## 2011-11-18 LAB — BASIC METABOLIC PANEL
Anion Gap: 10 (ref 7–16)
Calcium, Total: 8.7 mg/dL (ref 8.5–10.1)
Chloride: 97 mmol/L — ABNORMAL LOW (ref 98–107)
Co2: 35 mmol/L — ABNORMAL HIGH (ref 21–32)
Osmolality: 285 (ref 275–301)
Potassium: 3.5 mmol/L (ref 3.5–5.1)
Sodium: 142 mmol/L (ref 136–145)

## 2011-11-18 LAB — LIPID PANEL
Ldl Cholesterol, Calc: 90 mg/dL (ref 0–100)
Triglycerides: 202 mg/dL — ABNORMAL HIGH (ref 0–200)
VLDL Cholesterol, Calc: 40 mg/dL (ref 5–40)

## 2011-11-18 LAB — PROTIME-INR: Prothrombin Time: 13.8 secs (ref 11.5–14.7)

## 2011-11-18 LAB — HEMOGLOBIN A1C: Hemoglobin A1C: 9.3 % — ABNORMAL HIGH (ref 4.2–6.3)

## 2011-11-24 ENCOUNTER — Observation Stay: Payer: Self-pay | Admitting: Internal Medicine

## 2011-11-24 ENCOUNTER — Encounter: Payer: Self-pay | Admitting: Cardiology

## 2011-11-24 LAB — URINALYSIS, COMPLETE
Bilirubin,UR: NEGATIVE
Glucose,UR: NEGATIVE mg/dL (ref 0–75)
Nitrite: NEGATIVE
Protein: 30
RBC,UR: 1 /HPF (ref 0–5)
Specific Gravity: 1.016 (ref 1.003–1.030)
Squamous Epithelial: 4

## 2011-11-24 LAB — HEMOGLOBIN A1C: Hemoglobin A1C: 8.8 % — ABNORMAL HIGH (ref 4.2–6.3)

## 2011-11-24 LAB — COMPREHENSIVE METABOLIC PANEL
Alkaline Phosphatase: 81 U/L (ref 50–136)
BUN: 33 mg/dL — ABNORMAL HIGH (ref 7–18)
Calcium, Total: 9.3 mg/dL (ref 8.5–10.1)
Chloride: 96 mmol/L — ABNORMAL LOW (ref 98–107)
Co2: 30 mmol/L (ref 21–32)
Creatinine: 0.5 mg/dL — ABNORMAL LOW (ref 0.60–1.30)
EGFR (African American): >60
EGFR (Non-African Amer.): >60
Potassium: 4.4 mmol/L (ref 3.5–5.1)
SGOT(AST): 32 U/L (ref 15–37)
SGPT (ALT): 20 U/L
Sodium: 138 mmol/L (ref 136–145)
Total Protein: 7.7 g/dL (ref 6.4–8.2)

## 2011-11-24 LAB — CBC
HCT: 43 % (ref 35.0–47.0)
MCH: 28.5 pg (ref 26.0–34.0)
MCHC: 32.9 g/dL (ref 32.0–36.0)
Platelet: 211 10*3/uL (ref 150–440)
RBC: 4.95 10*6/uL (ref 3.80–5.20)
WBC: 12.8 10*3/uL — ABNORMAL HIGH (ref 3.6–11.0)

## 2011-11-24 LAB — TSH: Thyroid Stimulating Horm: 1.12 u[IU]/mL

## 2011-11-25 ENCOUNTER — Encounter: Payer: Self-pay | Admitting: Cardiology

## 2011-11-25 LAB — CK TOTAL AND CKMB (NOT AT ARMC)
CK, Total: 24 U/L (ref 21–215)
CK, Total: 19 U/L — ABNORMAL LOW (ref 21–215)
CK-MB: 0.5 ng/mL (ref 0.5–3.6)
CK-MB: 0.5 ng/mL (ref 0.5–3.6)

## 2011-11-25 LAB — BASIC METABOLIC PANEL
Anion Gap: 8 (ref 7–16)
BUN: 39 mg/dL — ABNORMAL HIGH (ref 7–18)
Chloride: 104 mmol/L (ref 98–107)
Creatinine: 1.12 mg/dL (ref 0.60–1.30)
EGFR (African American): >60
Glucose: 135 mg/dL — ABNORMAL HIGH (ref 65–99)
Osmolality: 289 (ref 275–301)

## 2011-11-25 LAB — DIGOXIN LEVEL: Digoxin: 0.84 ng/mL

## 2011-11-25 LAB — CBC WITH DIFFERENTIAL/PLATELET
Basophil %: 1 %
Eosinophil #: 0.1 10*3/uL (ref 0.0–0.7)
Eosinophil %: 1.1 %
HCT: 33 % — ABNORMAL LOW (ref 35.0–47.0)
Lymphocyte #: 2.1 10*3/uL (ref 1.0–3.6)
Lymphocyte %: 27.2 %
MCV: 86 fL (ref 80–100)
Monocyte %: 7.8 %
Neutrophil #: 5 10*3/uL (ref 1.4–6.5)
Neutrophil %: 62.9 %
RBC: 3.86 10*6/uL (ref 3.80–5.20)
RDW: 12.4 % (ref 11.5–14.5)
WBC: 7.9 10*3/uL (ref 3.6–11.0)

## 2011-11-26 ENCOUNTER — Telehealth: Payer: Self-pay | Admitting: Internal Medicine

## 2011-11-26 LAB — BASIC METABOLIC PANEL
Anion Gap: 8 (ref 7–16)
BUN: 25 mg/dL — ABNORMAL HIGH (ref 7–18)
Chloride: 104 mmol/L (ref 98–107)
Creatinine: 0.68 mg/dL (ref 0.60–1.30)
EGFR (Non-African Amer.): >60
Glucose: 149 mg/dL — ABNORMAL HIGH (ref 65–99)
Potassium: 3.8 mmol/L (ref 3.5–5.1)
Sodium: 142 mmol/L (ref 136–145)

## 2011-11-26 LAB — CBC WITH DIFFERENTIAL/PLATELET
Basophil %: 0.9 %
Eosinophil #: 0.1 10*3/uL (ref 0.0–0.7)
Eosinophil %: 0.9 %
Lymphocyte #: 2.3 10*3/uL (ref 1.0–3.6)
Lymphocyte %: 34.6 %
MCV: 87 fL (ref 80–100)
Monocyte %: 10.1 %
Neutrophil %: 53.5 %
Platelet: 171 10*3/uL (ref 150–440)
RBC: 3.91 10*6/uL (ref 3.80–5.20)
RDW: 13.3 % (ref 11.5–14.5)
WBC: 6.5 10*3/uL (ref 3.6–11.0)

## 2011-11-26 LAB — STOOL CULTURE

## 2011-11-26 LAB — URINE CULTURE

## 2011-11-26 NOTE — Telephone Encounter (Signed)
[phone] [facility]  @ [facility] wanted to know if pt could be seen earlier than 12/22/11 as new pt.  Pt needs to be under physican care before they can start home health

## 2011-11-26 NOTE — Telephone Encounter (Signed)
We can try to move her up if any 30-24min slots. Prefer 

## 2011-11-27 NOTE — Telephone Encounter (Signed)
Changed appointment  barbaraa @ [facility] aware of appointment change

## 2011-12-11 ENCOUNTER — Ambulatory Visit (INDEPENDENT_AMBULATORY_CARE_PROVIDER_SITE_OTHER): Payer: Medicare PPO | Admitting: Cardiovascular Disease

## 2011-12-11 ENCOUNTER — Encounter: Payer: Self-pay | Admitting: Cardiovascular Disease

## 2011-12-11 DIAGNOSIS — I10 Essential (primary) hypertension: Secondary | ICD-10-CM

## 2011-12-11 DIAGNOSIS — I6529 Occlusion and stenosis of unspecified carotid artery: Secondary | ICD-10-CM

## 2011-12-11 DIAGNOSIS — I5022 Chronic systolic (congestive) heart failure: Secondary | ICD-10-CM

## 2011-12-11 DIAGNOSIS — E785 Hyperlipidemia, unspecified: Secondary | ICD-10-CM

## 2011-12-11 DIAGNOSIS — I5020 Unspecified systolic (congestive) heart failure: Secondary | ICD-10-CM

## 2011-12-11 DIAGNOSIS — E119 Type 2 diabetes mellitus without complications: Secondary | ICD-10-CM

## 2011-12-11 MED ORDER — PRAVASTATIN SODIUM 10 MG PO TABS: 10.0000 mg | ORAL_TABLET | Freq: Every day | ORAL | Status: DC

## 2011-12-11 MED ORDER — DIGOXIN 125 MCG PO TABS: 125.0000 ug | ORAL_TABLET | Freq: Every day | ORAL | Status: AC

## 2011-12-11 MED ORDER — PRAVASTATIN SODIUM 40 MG PO TABS: 40.0000 mg | ORAL_TABLET | Freq: Every day | ORAL | Status: AC

## 2011-12-11 MED ORDER — CARVEDILOL 3.125 MG PO TABS: 3.1250 mg | ORAL_TABLET | Freq: Two times a day (BID) | ORAL | Status: AC

## 2011-12-11 MED ORDER — FUROSEMIDE 20 MG PO TABS: 20.0000 mg | ORAL_TABLET | Freq: Two times a day (BID) | ORAL | Status: DC

## 2011-12-11 MED ORDER — LISINOPRIL 10 MG PO TABS: 10.0000 mg | ORAL_TABLET | Freq: Every day | ORAL | Status: AC

## 2011-12-11 MED ORDER — POTASSIUM CHLORIDE ER 10 MEQ PO TBCR: 10.0000 meq | EXTENDED_RELEASE_TABLET | Freq: Every day | ORAL | Status: AC

## 2011-12-11 NOTE — Assessment & Plan Note (Signed)
She appears to be doing well after her recent discharge from the hospital. No further medication changes made. We did discuss limiting her fluid intake, salt intake. She will watch her weight and take extra diuretics for 3 pound weight gain.

## 2011-12-11 NOTE — Assessment & Plan Note (Signed)
50% disease on the left, less than 50% on the right. We'll continue aggressive cholesterol management.

## 2011-12-11 NOTE — Progress Notes (Signed)
Patient ID: [name], female    DOB: [birth year], 71 y.o.   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 On admission, she reported that she was not taking her medications appropriately. She has some cognitive issues/impairment.  Cardiac catheterization in February 2011 showed no occlusive disease. During her hospital course, she was restarted on heart failure medications, gentle diuresis with improvement of her symptoms. She presents today and reports that she has been feeling well with no significant change in her weight, no significant shortness of breath. BNP in the hospital was 8200, normal renal function. She presented with sinus tachycardia, rate 122 beats per minute consistent with heart failure Also diagnosed with Klebsiella pneumonia urinary tract infection, started on Levaquin Hemoglobin A1c 9.1 Total cholesterol 158, LDL 90, HDL 28  Echocardiogram October 2012; ejection fraction less than 25%, mild MR, moderate aortic valve stenosis Carotid ultrasound; 50% on the right, less than 50% on the left  EKG shows normal sinus rhythm with rate 87 beats per minute with nonspecific ST and T wave abnormality in V4 through V6, 1, aVL    Outpatient Encounter Prescriptions as of 12/11/2011  Medication Sig Dispense Refill  . acetaminophen (TYLENOL) 650 MG CR tablet Take 650 mg by mouth every 8 (eight) hours as needed.        Marland Kitchen aspirin 81 MG EC tablet Take 81 mg by mouth daily.        . carvedilol (COREG) 3.125 MG tablet Take 1 tablet (3.125 mg total) by mouth 2 (two) times daily with a meal.  60 tablet  11  . digoxin (LANOXIN) 0.125 MG tablet Take 1 tablet (125 mcg total) by mouth daily.  30 tablet  6    . furosemide (LASIX) 20 MG tablet Take 1 tablet (20 mg total) by mouth 2 (two) times daily.  60 tablet  6  . lisinopril (PRINIVIL,ZESTRIL) 10 MG tablet Take 1 tablet (10 mg total) by mouth daily.  30 tablet  11  . metFORMIN (GLUCOPHAGE) 1000 MG tablet Take 1,000 mg by mouth 2 (two) times daily with a meal.        . potassium chloride (K-DUR) 10 MEQ tablet Take 1 tablet (10 mEq total) by mouth daily.  30 tablet  11  . pravastatin (PRAVACHOL) 10 MG tablet Take 1 tablet (10 mg total) by mouth daily.  30 tablet  11     Review of Systems  Constitutional: Negative.   HENT: Negative.   Eyes: Negative.   Respiratory: Negative.   Cardiovascular: Negative.   Gastrointestinal: Negative.   Musculoskeletal: Negative.   Skin: Negative.   Neurological: Negative.   Hematological: Negative.   Psychiatric/Behavioral: Negative.   All other systems reviewed and are negative.    BP 115/73  Pulse 87  Ht 4\' 11"  (1.499 m)  Wt 146 lb (66.225 kg)  BMI 29.49 kg/m2  Physical Exam  Nursing note and vitals reviewed. Constitutional: She is oriented to person, place, and time. She appears well-developed and well-nourished.  HENT:  Head: Normocephalic.  Nose: Nose normal.  Mouth/Throat: Oropharynx is clear and moist.  Eyes: Conjunctivae are normal. Pupils are equal, round, and reactive to light.  Neck: Normal range of motion. Neck supple. No JVD present.  Cardiovascular: Normal rate,  regular rhythm, S1 normal, S2 normal and intact distal pulses.  Exam reveals no gallop and no friction rub.   Murmur heard.  Crescendo systolic murmur is present with a grade of 2/6  Pulmonary/Chest: Effort normal and breath sounds normal. No respiratory distress. She has no wheezes. She has no rales. She exhibits no tenderness.  Abdominal: Soft. Bowel sounds are normal. She exhibits no distension. There is no tenderness.  Musculoskeletal: Normal range of motion. She exhibits no edema and no tenderness.  Lymphadenopathy:     She has no cervical adenopathy.  Neurological: She is alert and oriented to person, place, and time. Coordination normal.  Skin: Skin is warm and dry. No rash noted. No erythema.  Psychiatric: She has a normal mood and affect. Her behavior is normal. Judgment and thought content normal.         Assessment and Plan       A

## 2011-12-11 NOTE — Assessment & Plan Note (Signed)
Blood pressure is well controlled on today's visit. No changes made to the medications. 

## 2011-12-11 NOTE — Patient Instructions (Addendum)
You are doing well. No medication changes were made. Monitor your weight close If you have a three pounds weight gain, take extra lasix at 2 pm  Goal weight less than 145  Please call us if you have new issues that need to be addressed before your next appt.  Your physician wants you to follow-up in: 3 months.  You will receive a reminder letter in the mail two months in advance. If you don't receive a letter, please call our office to schedule the follow-up appointment.

## 2011-12-11 NOTE — Assessment & Plan Note (Signed)
Her diabetes is a significant problem.  We have encouraged continued exercise, careful diet management in an effort to lose weight.

## 2011-12-11 NOTE — Assessment & Plan Note (Signed)
We'll need to be more aggressive with her cholesterol given underlying carotid arterial disease estimated 50% of the right. I suspect she has underlying coronary disease as well. Goal LDL should be less than 70. We will suggest she increase her pravastatin to 40 mg daily.

## 2011-12-12 ENCOUNTER — Ambulatory Visit (INDEPENDENT_AMBULATORY_CARE_PROVIDER_SITE_OTHER): Payer: Medicare PPO | Admitting: Internal Medicine

## 2011-12-12 ENCOUNTER — Ambulatory Visit: Payer: Medicare PPO | Admitting: Internal Medicine

## 2011-12-12 ENCOUNTER — Encounter: Payer: Self-pay | Admitting: Internal Medicine

## 2011-12-12 VITALS — BP 122/60 | HR 94 | Temp 97.7°F | Wt 147.0 lb

## 2011-12-12 DIAGNOSIS — I10 Essential (primary) hypertension: Secondary | ICD-10-CM

## 2011-12-12 DIAGNOSIS — R413 Other amnesia: Secondary | ICD-10-CM | POA: Insufficient documentation

## 2011-12-12 DIAGNOSIS — E119 Type 2 diabetes mellitus without complications: Secondary | ICD-10-CM

## 2011-12-12 DIAGNOSIS — E1169 Type 2 diabetes mellitus with other specified complication: Secondary | ICD-10-CM

## 2011-12-12 DIAGNOSIS — E669 Obesity, unspecified: Secondary | ICD-10-CM

## 2011-12-12 DIAGNOSIS — Z1239 Encounter for other screening for malignant neoplasm of breast: Secondary | ICD-10-CM

## 2011-12-12 DIAGNOSIS — I5020 Unspecified systolic (congestive) heart failure: Secondary | ICD-10-CM

## 2011-12-12 MED ORDER — METFORMIN HCL 1000 MG PO TABS: 1000.0000 mg | ORAL_TABLET | Freq: Two times a day (BID) | ORAL | Status: DC

## 2011-12-12 NOTE — Assessment & Plan Note (Signed)
BG currently well controlled by report. Will plan to check A1c next month with labs. Will plan to continue metformin for now.  Will request records on previous evaluation and management, and recent renal function from [facility].

## 2011-12-12 NOTE — Assessment & Plan Note (Signed)
Patient with short-term memory loss. Will set up cognitive testing. We'll also review recent lab work including TSH and B12 drawn at [facility]. Followup one month.

## 2011-12-12 NOTE — Progress Notes (Signed)
Subjective:    Patient ID: [name], female    DOB: [birth year], 71 y.o.   MRN: [id number]  HPI 71 year old female with systolic heart failure, diabetes mellitus, hypertension, and memory loss presents to establish care. In regards to her systolic heart failure, her daughter reports that her symptoms have recently been well-controlled. She was just seen by her cardiologist yesterday. Her weight has been stable. She denies further shortness of breath or lower extremity edema. She reports full compliance with her medications.  In regards to her diabetes, her daughter reports that most fasting sugars have been between 101 50. Her last hemoglobin A1c one month ago was 9%, per daughter's report, however she was noncompliant with medications at that time. She is currently only taking metformin. She denies any complications with that medication. She has also been limiting her intake of processed carbohydrates.  Her daughter is concerned today about a several month history of short-term memory loss. She notes that her mother frequently becomes confused. Symptoms first began approximately one year ago, but over the last few months have gotten much worse. She is unable to manage her medications or finances.  She does not drive. She has not been evaluated for this in the past. She did have recent lab work during her hospital admission, which her daughter thinks included a TSH and B12 level. She is unsure of the results of this.  Outpatient Encounter Prescriptions as of 12/12/2011  Medication Sig Dispense Refill  . acetaminophen (TYLENOL) 650 MG CR tablet Take 650 mg by mouth every 8 (eight) hours as needed.        Marland Kitchen aspirin 81 MG EC tablet Take 81 mg by mouth daily.        . carvedilol (COREG) 3.125 MG tablet Take 1 tablet (3.125 mg total) by mouth 2 (two) times daily with a meal.  60 tablet  11  . digoxin (LANOXIN) 0.125 MG tablet Take 1 tablet (125 mcg total) by mouth daily.  30 tablet  6  . furosemide  (LASIX) 20 MG tablet Take 20 mg by mouth daily. Take BID w/3lbs wt increase      . lisinopril (PRINIVIL,ZESTRIL) 10 MG tablet Take 1 tablet (10 mg total) by mouth daily.  30 tablet  11  . metFORMIN (GLUCOPHAGE) 1000 MG tablet Take 1 tablet (1,000 mg total) by mouth 2 (two) times daily with a meal.  60 tablet  6  . potassium chloride (K-DUR) 10 MEQ tablet Take 1 tablet (10 mEq total) by mouth daily.  30 tablet  11  . pravastatin (PRAVACHOL) 40 MG tablet Take 1 tablet (40 mg total) by mouth daily.  90 tablet  3    Review of Systems  Constitutional: Negative for fever, chills, appetite change, fatigue and unexpected weight change.  HENT: Negative for ear pain, congestion, sore throat, trouble swallowing, neck pain, voice change and sinus pressure.   Eyes: Negative for visual disturbance.  Respiratory: Negative for cough, shortness of breath, wheezing and stridor.   Cardiovascular: Negative for chest pain, palpitations and leg swelling.  Gastrointestinal: Negative for nausea, vomiting, abdominal pain, diarrhea, constipation, blood in stool, abdominal distention and anal bleeding.  Genitourinary: Negative for dysuria and flank pain.  Musculoskeletal: Negative for myalgias, arthralgias and gait problem.  Skin: Negative for color change and rash.  Neurological: Negative for dizziness and headaches.  Hematological: Negative for adenopathy. Does not bruise/bleed easily.  Psychiatric/Behavioral: Positive for confusion and decreased concentration. Negative for suicidal ideas, sleep disturbance and dysphoric mood.  The patient is not nervous/anxious.    BP 122/60  Pulse 94  Temp(Src) 97.7 F (36.5 C) (Oral)  Wt 147 lb (66.679 kg)  SpO2 94%     Objective:   Physical Exam  Constitutional: She is oriented to person, place, and time. She appears well-developed and well-nourished. No distress.  HENT:  Head: Normocephalic and atraumatic.  Right Ear: External ear normal.  Left Ear: External ear  normal.  Nose: Nose normal.  Mouth/Throat: Oropharynx is clear and moist. No oropharyngeal exudate.  Eyes: Conjunctivae are normal. Pupils are equal, round, and reactive to light. Right eye exhibits no discharge. Left eye exhibits no discharge. No scleral icterus.  Neck: Normal range of motion. Neck supple. No tracheal deviation present. No thyromegaly present.  Cardiovascular: Normal rate, regular rhythm, normal heart sounds and intact distal pulses.  Exam reveals no gallop and no friction rub.   No murmur heard. Pulmonary/Chest: Effort normal and breath sounds normal. No respiratory distress. She has no wheezes. She has no rales. She exhibits no tenderness.  Abdominal: Soft. Bowel sounds are normal.  Musculoskeletal: Normal range of motion. She exhibits no edema and no tenderness.  Lymphadenopathy:    She has no cervical adenopathy.  Neurological: She is alert and oriented to person, place, and time. No cranial nerve deficit. She exhibits normal muscle tone. Coordination normal.  Skin: Skin is warm and dry. No rash noted. She is not diaphoretic. No erythema. No pallor.  Psychiatric: She has a normal mood and affect. Her speech is normal and behavior is normal. Judgment and thought content normal. She exhibits abnormal recent memory.          Assessment & Plan:

## 2011-12-12 NOTE — Assessment & Plan Note (Signed)
Blood pressure well controlled today Continue current medications 

## 2011-12-12 NOTE — Assessment & Plan Note (Signed)
Appears to be doing well. Euvolemic on exam. Will continue current meds.  She will monitor weight carefully and call if >3lb weight increase. Follow up 1 month.

## 2011-12-12 NOTE — Patient Instructions (Signed)
Labs in 3-4 weeks, then follow up visit 1 month.

## 2011-12-16 ENCOUNTER — Telehealth: Payer: Self-pay | Admitting: Internal Medicine

## 2011-12-16 NOTE — Telephone Encounter (Signed)
OK to dc

## 2011-12-16 NOTE — Telephone Encounter (Signed)
[phone] Pt daughter called she had called [facility] to come pick up [employer] and they need a order to discontinue   Fax [phone]

## 2011-12-16 NOTE — Telephone Encounter (Signed)
That is fine 

## 2011-12-16 NOTE — Telephone Encounter (Signed)
Order pending signature.

## 2011-12-19 ENCOUNTER — Telehealth: Payer: Self-pay | Admitting: *Deleted

## 2011-12-19 NOTE — Telephone Encounter (Signed)
That is fine 

## 2011-12-19 NOTE — Telephone Encounter (Signed)
[facility] RN) called requesting to know if it is ok to give pt samples they have of glucerna. RN suggested this for pt to drink in the evening on days she ate less. OK?

## 2011-12-19 NOTE — Telephone Encounter (Signed)
Order will be faxed this afternoon

## 2011-12-22 ENCOUNTER — Ambulatory Visit: Payer: Medicare PPO | Admitting: Internal Medicine

## 2011-12-22 NOTE — Telephone Encounter (Signed)
RN informed.

## 2011-12-31 DIAGNOSIS — E119 Type 2 diabetes mellitus without complications: Secondary | ICD-10-CM

## 2011-12-31 DIAGNOSIS — I10 Essential (primary) hypertension: Secondary | ICD-10-CM

## 2011-12-31 DIAGNOSIS — I5033 Acute on chronic diastolic (congestive) heart failure: Secondary | ICD-10-CM

## 2011-12-31 DIAGNOSIS — I428 Other cardiomyopathies: Secondary | ICD-10-CM

## 2012-01-06 ENCOUNTER — Ambulatory Visit: Payer: Self-pay | Admitting: Ophthalmology

## 2012-01-08 IMAGING — CT CT HEAD WITHOUT CONTRAST
2 series · 16 of 30 positions shown, 20 images · non-contrast
Comparison: none

REASON FOR EXAM: weak, confused
COMMENTS:

PROCEDURE:     CT  - CT HEAD WITHOUT CONTRAST  - June 15, 2010 [DATE]
RESULT:     History: Weakness.
Comparison Study: Head CT 10/22/2008

[Series 2: without · axial · non-contrast · 0.42mm/px · z∈[+987,+1107]mm · 13 of 29 slices shown, 17 images]
[im 3/29  brain]
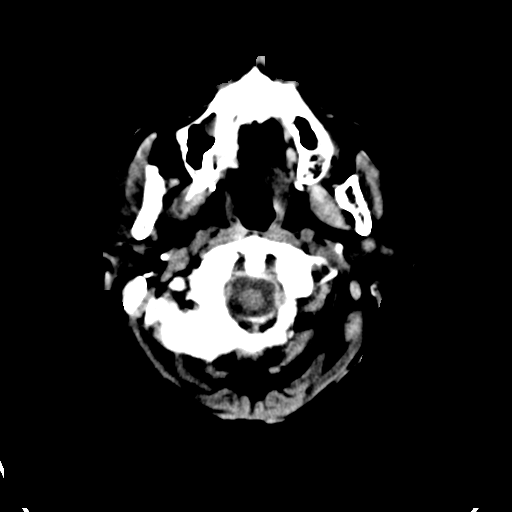
[im 3/29  bone]
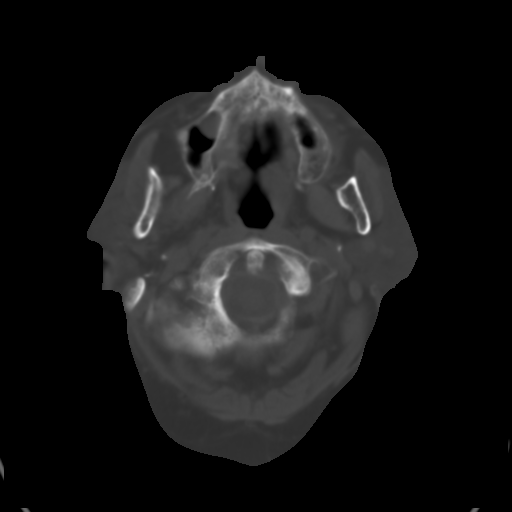
[im 5/29  brain]
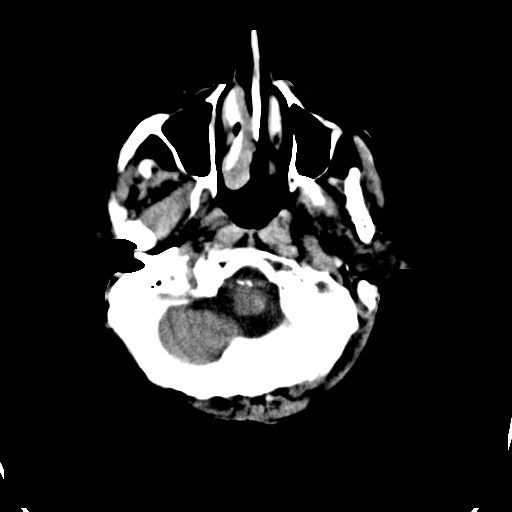
[im 7/29  brain]
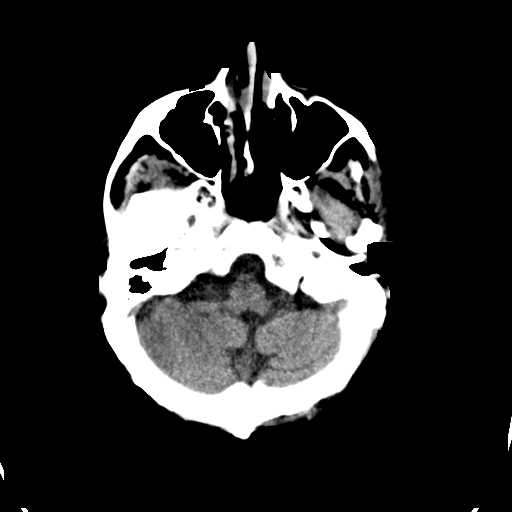
[im 9/29  brain]
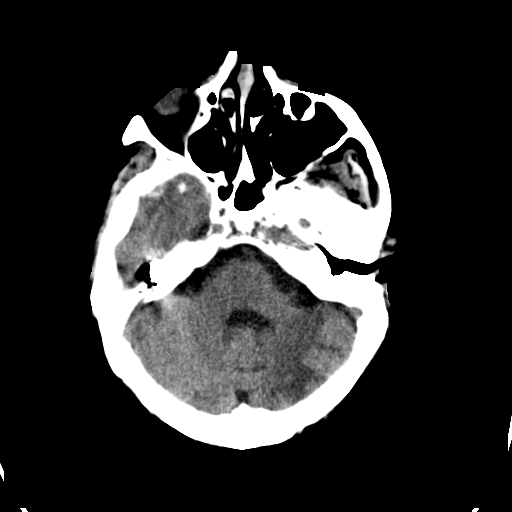
[im 11/29  brain]
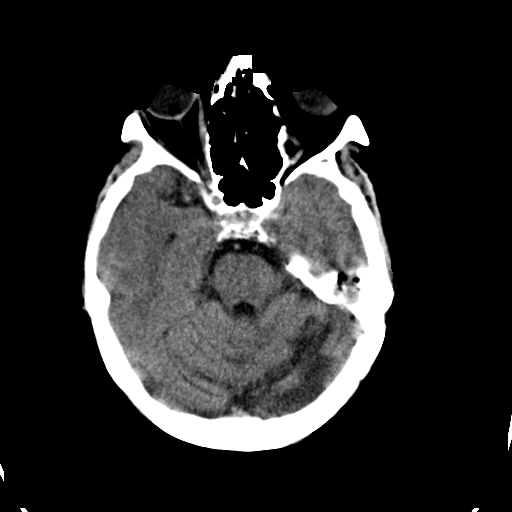
[im 11/29  bone]
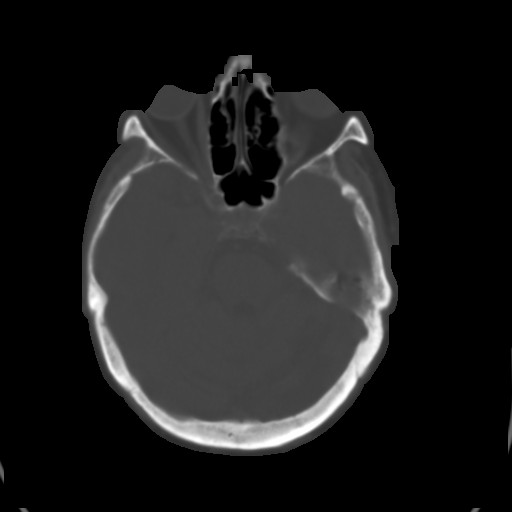
[im 13/29  brain]
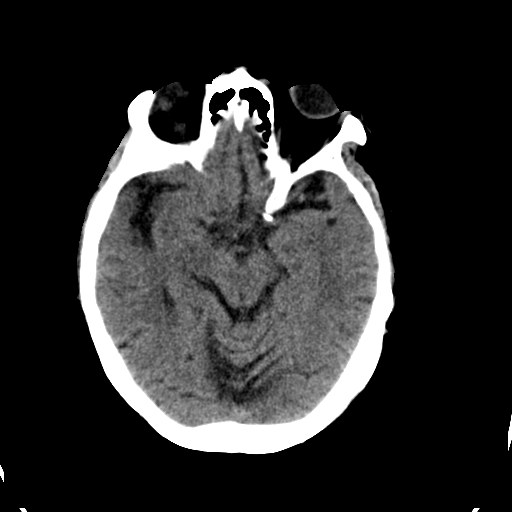
[im 15/29  brain]
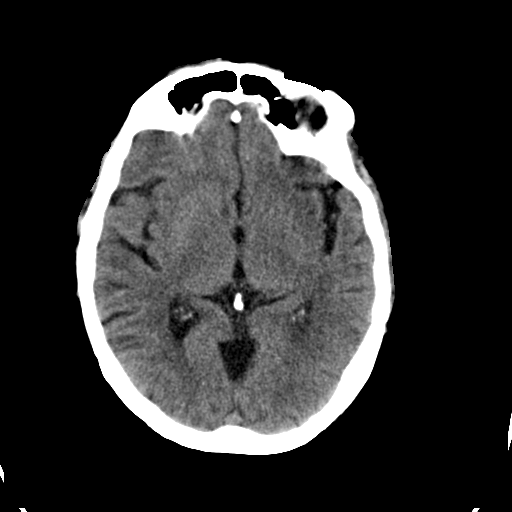
[im 17/29  brain]
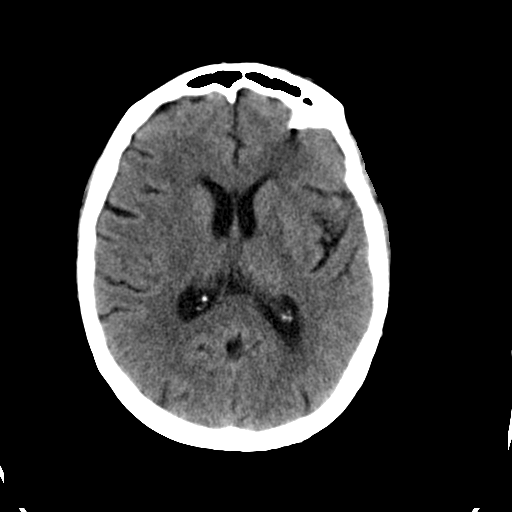
[im 19/29  brain]
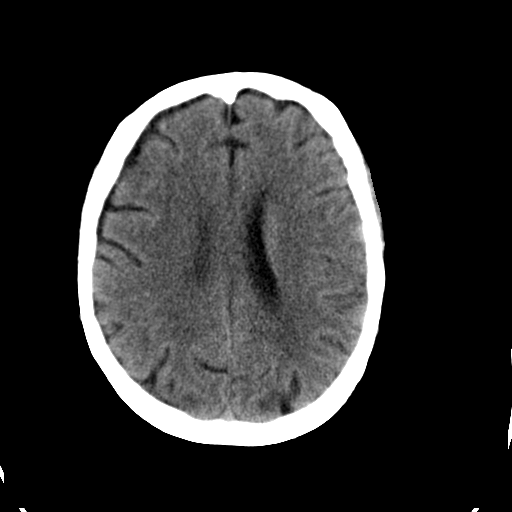
[im 19/29  bone]
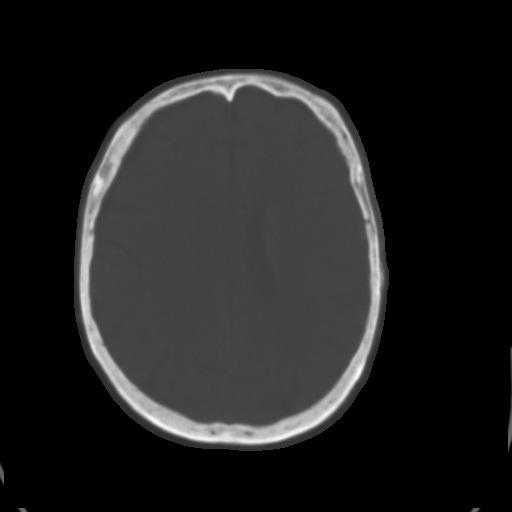
[im 21/29  brain]
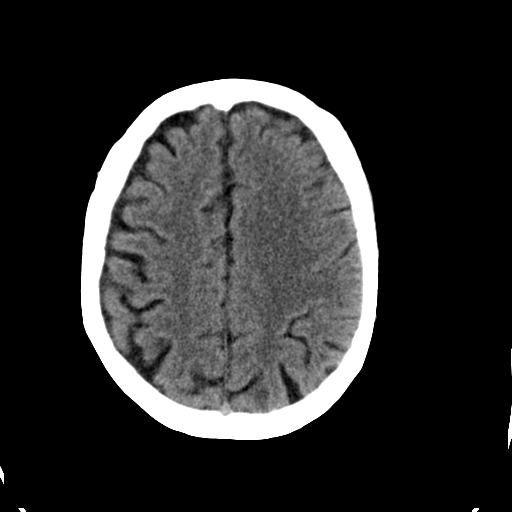
[im 23/29  brain]
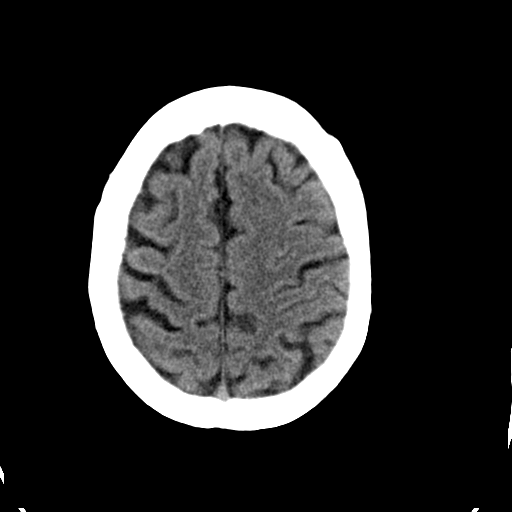
[im 25/29  brain]
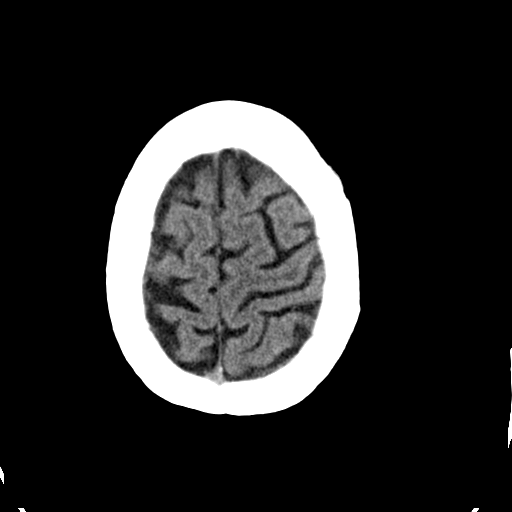
[im 27/29  brain]
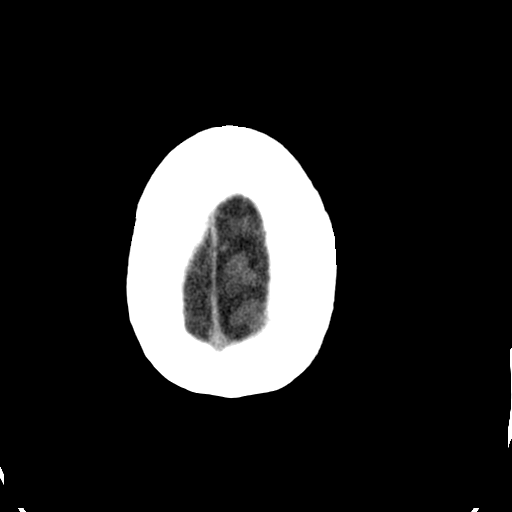
[im 27/29  bone]
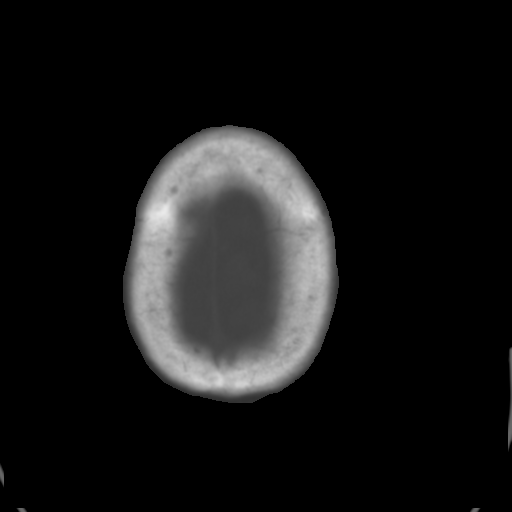

[Series 3: bone · axial · 0.42mm/px · z∈[+987,+1027]mm · 3 of 29 slices shown]
[im 3/29  bone]
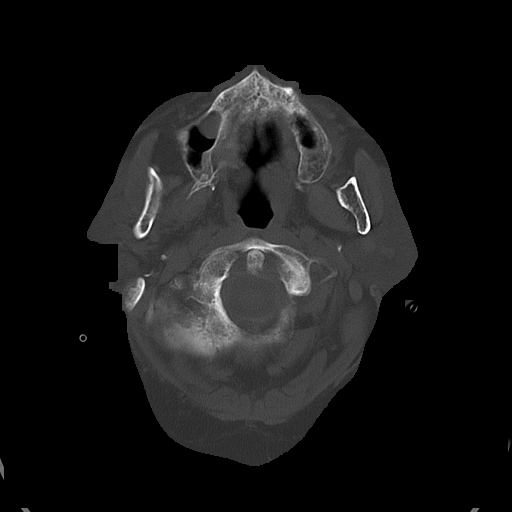
[im 7/29  bone]
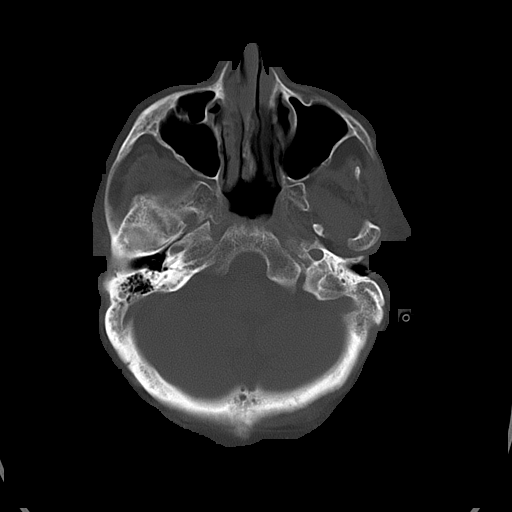
[im 11/29  bone]
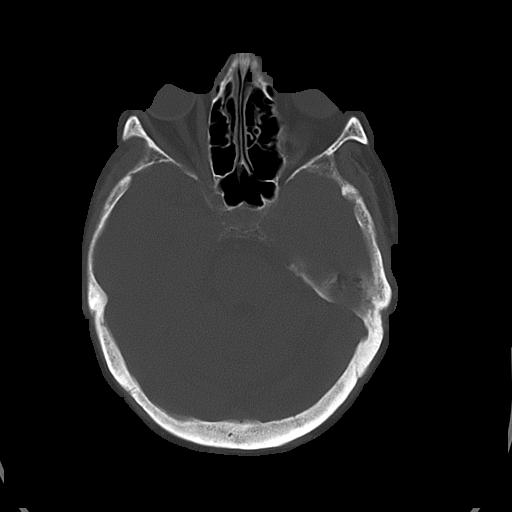

[16 of 30 positions shown; findings below may reference images not displayed]

FINDINGS: Encephalomalacia noted in the left cerebellum most consistent with
old left cerebellar infarct. No mass lesion noted. No hydrocephalus. No
hemorrhage. No acute bony abnormalities Mild mucosal thickening of the
maxillary sinuses.
IMPRESSION: No acute abnormality. Encephalomalacia  left cerebellar
hemisphere. This is most likely from old infarct. Similar findings noted on
prior study of 10/22/2008.

## 2012-01-09 ENCOUNTER — Other Ambulatory Visit: Payer: Medicare PPO

## 2012-01-12 ENCOUNTER — Other Ambulatory Visit: Payer: Medicare PPO

## 2012-01-13 ENCOUNTER — Ambulatory Visit: Payer: Self-pay | Admitting: Ophthalmology

## 2012-01-14 ENCOUNTER — Ambulatory Visit: Payer: Medicare PPO | Admitting: Internal Medicine

## 2012-01-15 ENCOUNTER — Encounter: Payer: Self-pay | Admitting: Cardiovascular Disease

## 2012-01-19 ENCOUNTER — Other Ambulatory Visit: Payer: Medicare PPO

## 2012-01-28 ENCOUNTER — Ambulatory Visit: Payer: Medicare PPO | Admitting: Internal Medicine

## 2012-01-28 DIAGNOSIS — Z0289 Encounter for other administrative examinations: Secondary | ICD-10-CM

## 2012-02-02 ENCOUNTER — Ambulatory Visit: Payer: Self-pay | Admitting: Ophthalmology

## 2012-02-13 ENCOUNTER — Encounter: Payer: Self-pay | Admitting: Cardiovascular Disease

## 2012-03-15 ENCOUNTER — Ambulatory Visit: Payer: Medicare PPO | Admitting: Cardiovascular Disease

## 2012-03-29 ENCOUNTER — Ambulatory Visit: Payer: Medicare PPO | Admitting: Cardiovascular Disease

## 2012-05-14 ENCOUNTER — Encounter: Payer: Self-pay | Admitting: Internal Medicine

## 2012-11-20 ENCOUNTER — Other Ambulatory Visit: Payer: Self-pay | Admitting: Internal Medicine

## 2013-06-18 IMAGING — CT CT HEAD WITHOUT CONTRAST
2 series · 16 of 30 positions shown, 20 images · non-contrast
Comparison: none

REASON FOR EXAM: altered ms
COMMENTS:

PROCEDURE:     CT  - CT HEAD WITHOUT CONTRAST  - November 24, 2011 [DATE]
RESULT:
TECHNIQUE: Noncontrast CT of the brain is compared to the study 15 June, 2010.

[Series 2: without · axial · non-contrast · 0.39mm/px · z∈[-130,-10]mm · 13 of 29 slices shown, 17 images]
[im 3/29  brain]
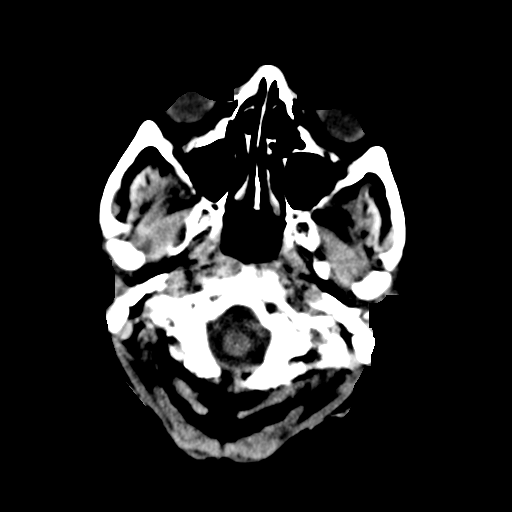
[im 3/29  bone]
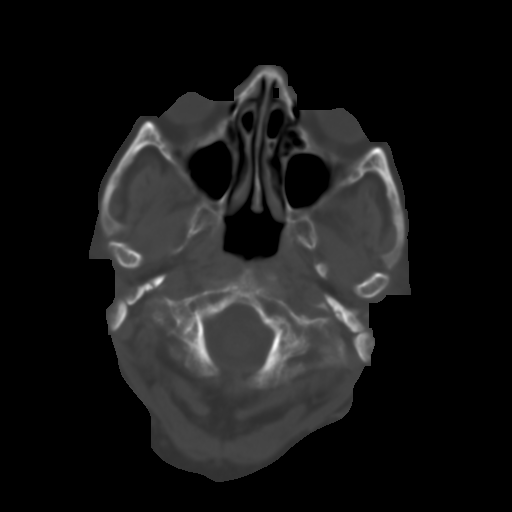
[im 5/29  brain]
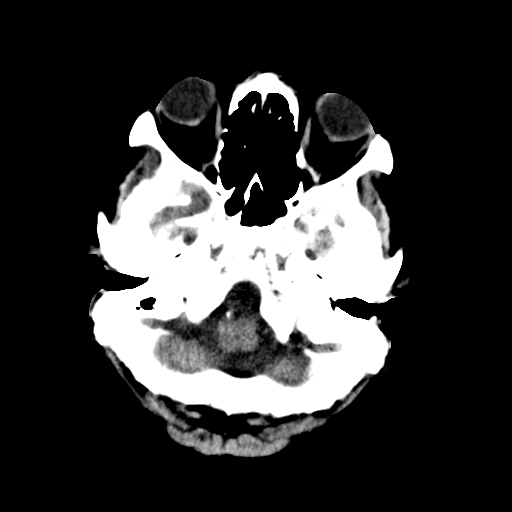
[im 7/29  brain]
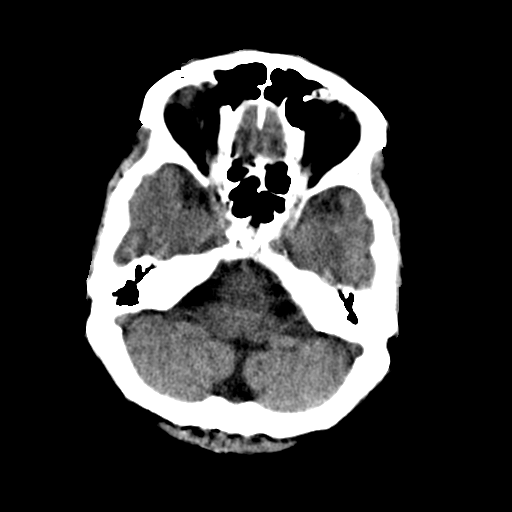
[im 9/29  brain]
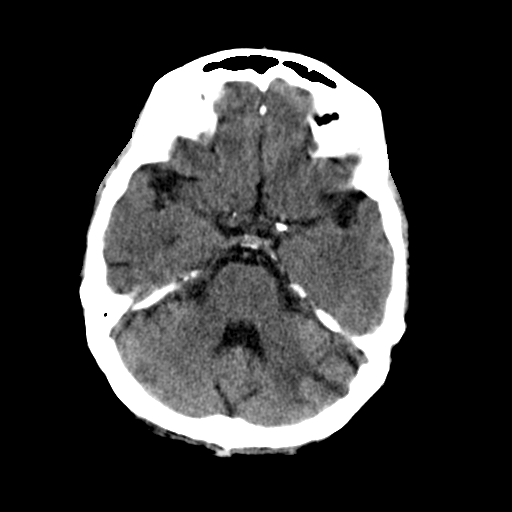
[im 11/29  brain]
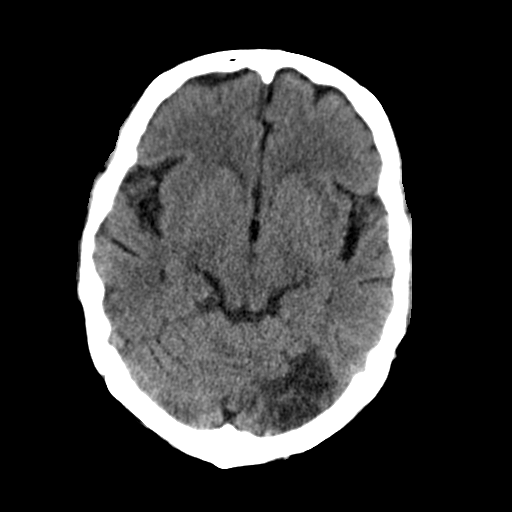
[im 11/29  bone]
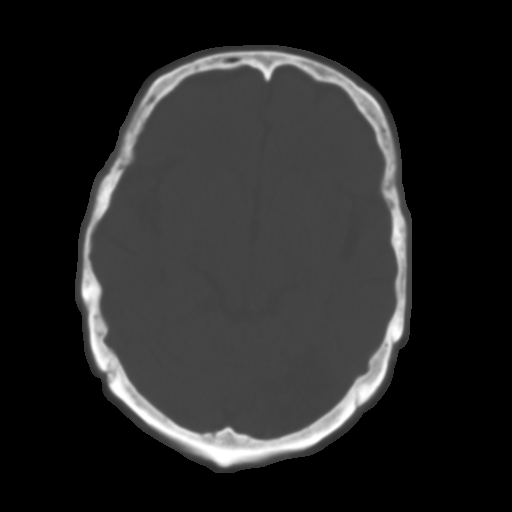
[im 13/29  brain]
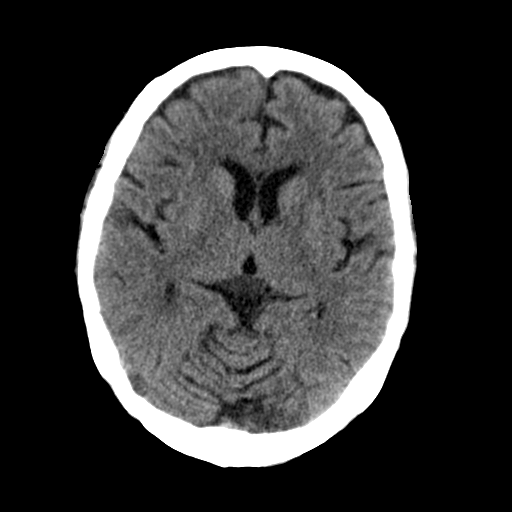
[im 15/29  brain]
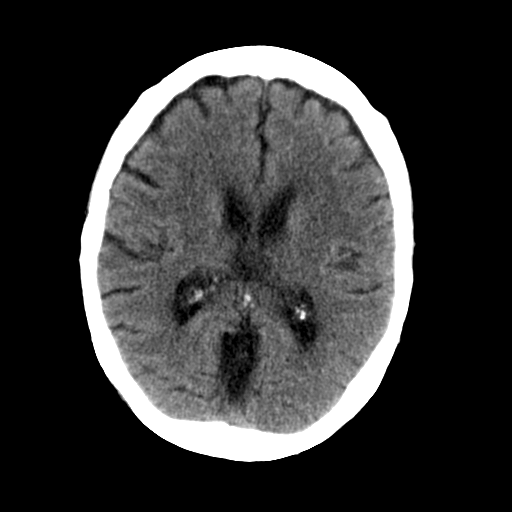
[im 17/29  brain]
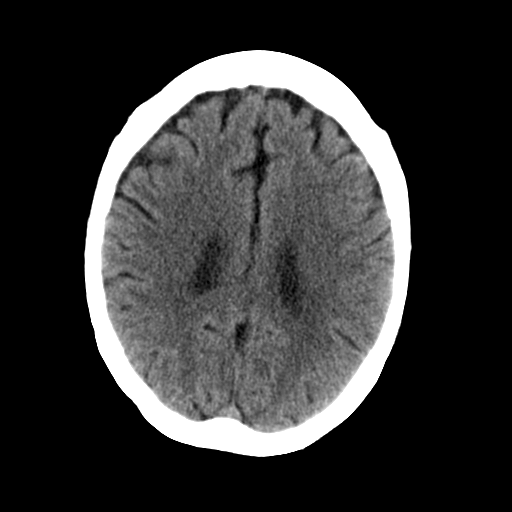
[im 19/29  brain]
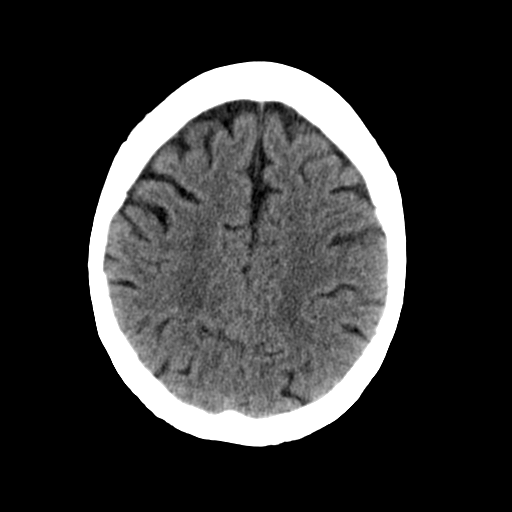
[im 19/29  bone]
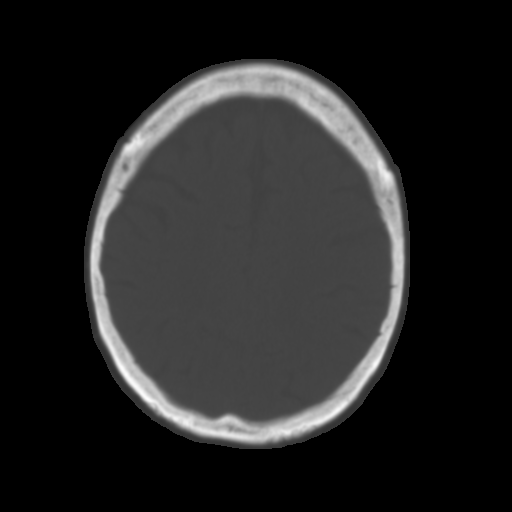
[im 21/29  brain]
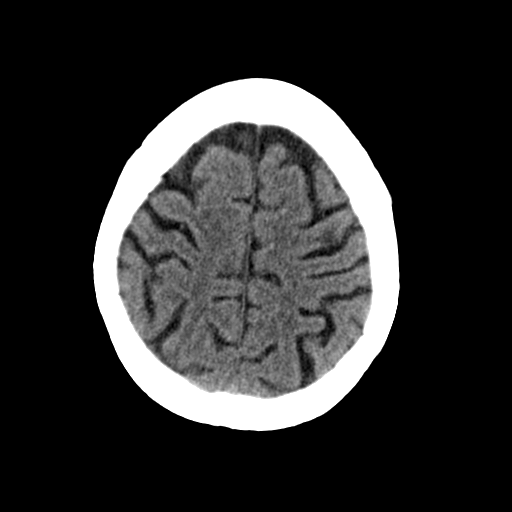
[im 23/29  brain]
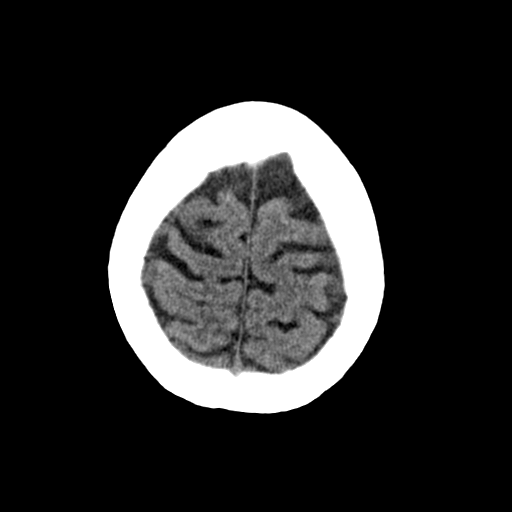
[im 25/29  brain]
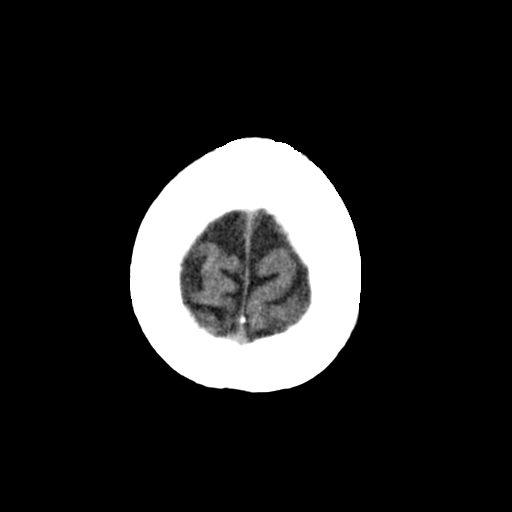
[im 27/29  brain]
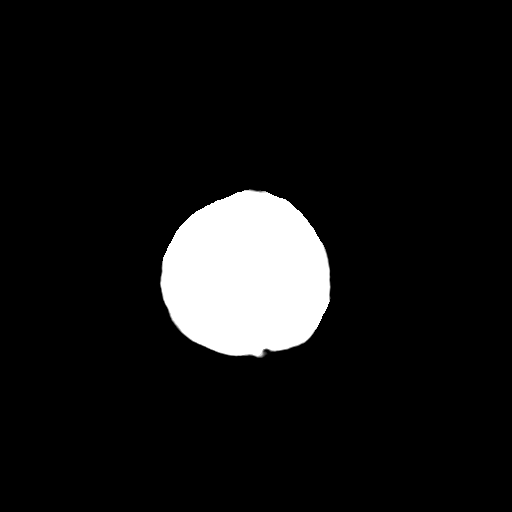
[im 27/29  bone]
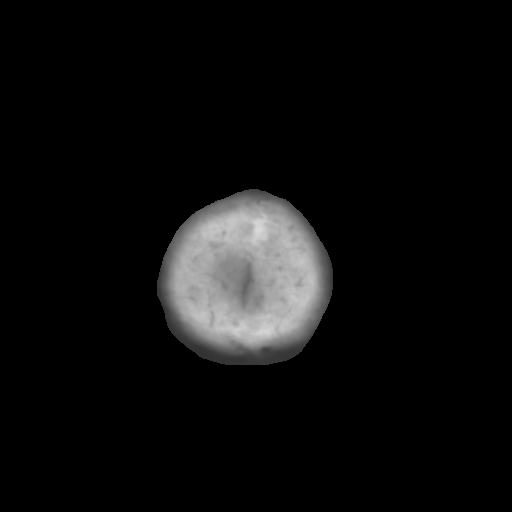

[Series 3: bone · axial · 0.39mm/px · z∈[-130,-90]mm · 3 of 29 slices shown]
[im 3/29  bone]
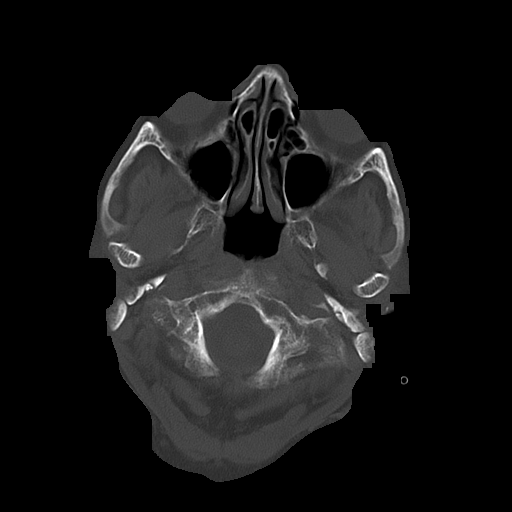
[im 7/29  bone]
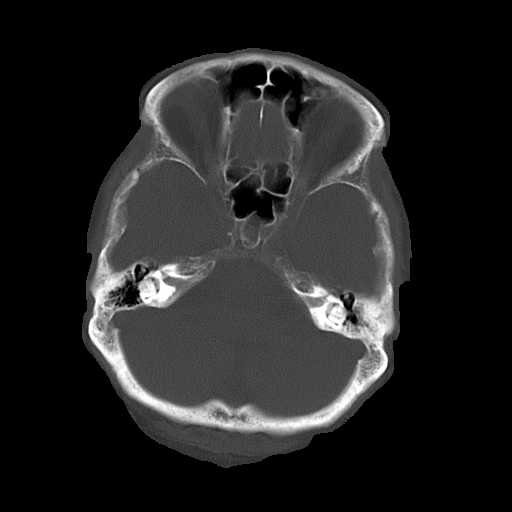
[im 11/29  bone]
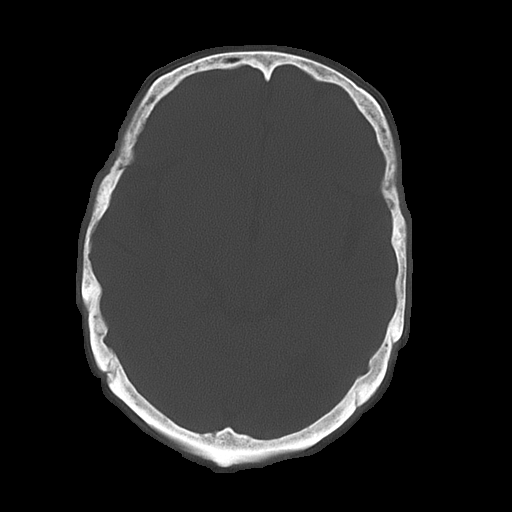

[16 of 30 positions shown; findings below may reference images not displayed]

FINDINGS: There is a stable area of encephalomalacia in the superior
posterior left cerebellar region. Mild atrophy is present diffusely. There
is no intracranial hemorrhage, mass effect or midline shift. No territorial
infarct is present otherwise. The sinuses and mastoid air cells show a
stable appearance with some chronic sclerosis in the mastoid regions
bilaterally. The calvarium is intact.
IMPRESSION: Stable appearance of the brain with superior posterior left
cerebellar encephalomalacia presumably secondary to a previous infarct.

## 2014-09-12 DEATH — deceased

## 2015-02-04 NOTE — H&P (Signed)
PATIENT NAME:  [name], [name] MR#:  [id number] DATE OF BIRTH:  Oct 28, 1940  DATE OF ADMISSION:  11/17/2011  PRIMARY CARE PHYSICIAN: Currently none.  REFERRING PHYSICIAN: Dr. Enedina Finner    CHIEF COMPLAINT: Shortness of breath.   HISTORY OF PRESENT ILLNESS: The patient is a 74 year old Caucasian female with past medical history of chronic systolic heart failure, ejection fraction less than 25%, hypertension, diabetes mellitus, history of transient ischemic attack, history of thrombocytopenia, prior episode of C. difficile colitis, and debilitation who presented to [facility] with complaints of shortness of breath. The patient's shortness of breath has been worsening over the past several days. She has known underlying chronic systolic heart failure with an ejection fraction of 25%. She reports that she has been out of her medications for at least one week. She has not had any medical follow-up since her discharge from [facility] in October of 2012. She was admitted in October with an acute systolic heart failure exacerbation at that point in time. The patient also reports that she's had some dietary indiscretion. She was eating deli meats recently that were salty as well as potato chips. She reports that she had significant dyspnea with exertion earlier today. She has also had some increasing lower extremity edema. She currently denies having chest pains. In the Emergency Department, the patient was given 20 mg IV x1. Her first set of troponin is negative. EKG demonstrates sinus tachycardia. We were subsequently called for admission to [facility].   PAST MEDICAL HISTORY:  1. Chronic systolic heart failure, ejection fraction less than 25%.  2. Hypertension.  3. Diabetes mellitus.  4. History of thrombocytopenia.  5. History of transient ischemic attack.  6. Onychomycosis.  7. History of GI bleeding.  8. Prior episode of Clostridium difficile  colitis.  9. Prior episode of acute renal failure. 10. Generalized debility.   PAST SURGICAL HISTORY:  1. Cholecystectomy.  2. Cesarean section.   ALLERGIES: Penicillin.   MEDICATIONS FROM DISCHARGE IN OCTOBER 2012:  1. Aspirin 81 mg daily.  2. Coreg 3.125 mg p.o. b.i.d.   3. Digoxin 0.125 mg p.o. daily.  4. Lasix 20 mg daily.  5. Glimepiride 2 mg p.o. daily.  6. Lisinopril 10 mg daily.  7. Metformin 1000 mg p.o. b.i.d.  8. Pravastatin 10 mg daily.   SOCIAL HISTORY: The patient resides at home with her husband. She is fairly independent with activities of daily living. She currently denies tobacco, alcohol, or illicit drug use. Her husband had recent amputation.   FAMILY HISTORY: The patient's mother died secondary to colon cancer and father died secondary to lung cancer.   REVIEW OF SYSTEMS: CONSTITUTIONAL: Denies fevers, chills, or weight loss. Does report fatigue. EYES: Denies diplopia or blurry vision. HEENT: Denies headaches, hearing loss, tinnitus, epistaxis, sore throat, dysphagia. RESPIRATORY: Denies cough or wheezing. Does have shortness of breath. CARDIOVASCULAR: Denies chest pain. Does have dyspnea on exertion, lower extremity edema. Denies palpitations. GI: Denies nausea, vomiting, diarrhea, or bloody stools. GU: Denies frequency or urgency. Does have some intermittent dysuria. ENDOCRINE: Denies polyuria, polydipsia, or polyphagia. HEMATOLOGIC/LYMPHATIC: Denies easy bruisability, bleeding, or swollen lymph nodes. INTEGUMENTARY: Denies skin rashes or lesions. MUSCULOSKELETAL: Denies joint pain, swelling, or redness. NEUROLOGIC: Denies focal extremity numbness, weakness, or tingling. PSYCHIATRIC: Denies depression or bipolar disorder. The patient's daughter reports recent memory loss. ALLERGY/IMMUNOLOGIC: Denies seasonal allergies or history of immunodeficiency   LABORATORY DATA: Sodium 139, potassium 4.7, chloride 99, CO2 28, BUN 17, creatinine 0.45,  glucose 332. BNP is elevated  at 8195. Total protein 7.1, albumin 3.0, total bilirubin 0.9, alkaline phosphatase 148, AST 52, ALT 26. Troponin less than 0.02. TSH 1.03. Digoxin less than 0.1. CBC shows WBC 12.1, hemoglobin 12.8, hematocrit 38, platelets 247. INR 1.0. Urinalysis shows glucose greater than 500 mg/dL, specific gravity 1.610, protein 100 mg/dL, nitrites positive, +1 leukocyte esterase 6 RBCs per high-power field, 160 WBCs per high-power field. Echo Doppler from 08/04/2011 shows ejection fraction less than 25% with mild diastolic dysfunction. There is mild mitral regurgitation, moderate to severe aortic stenosis   PHYSICAL EXAMINATION:   VITAL SIGNS: Temperature 95.5, pulse 122, respirations 26, blood pressure 213/104.   GENERAL: Well developed, well nourished Caucasian female who appears her stated age currently in no acute distress.   HEENT: Normocephalic, atraumatic. Extraocular movements are intact. Pupils equal, round, and reactive to light. No scleral icterus. Conjunctivae are pink. No epistaxis noted. Gross hearing intact. Oral mucosa moist.   NECK: Supple without JVD or lymphadenopathy.   LUNGS: Lungs demonstrate rales at both bases with normal respiratory effort.   HEART: S1, S2. The patient was noted to be tachycardic. 2/6 systolic ejection murmur heard.   ABDOMEN: Soft, nontender, nondistended. Bowel sounds positive. No rebound or guarding. No gross organomegaly appreciated.   EXTREMITIES: 1+ bilateral pitting edema noted. No cyanosis or clubbing.   NEUROLOGIC: The patient is alert and oriented to time, person, and place. Strength is 5 out of 5 in both upper and lower extremities.   GU: No suprapubic tenderness is noted at this time.   MUSCULOSKELETAL: No joint redness, swelling, or tenderness appreciated.   SKIN: Warm and dry. No rashes noted.   PSYCHIATRIC: The patient is with appropriate affect, however, she appears to have limited insight into her current illness.   IMPRESSION: This is a  74 year old Caucasian female with past medical history of chronic systolic heart failure, ejection fraction less than 25%, hypertension, diabetes mellitus, onychomycosis, prior episode of GI bleeding, acute renal failure, prior episode of C. difficile colitis, generalized debility, and medication nonadherence who presented to [facility] with acute onset of shortness of breath secondary to acute systolic heart failure exacerbation. The patient had dietary indiscretion and also has lack of primary care follow-up. 1. Acute systolic heart failure. This appears to be multifactorial. The patient has not been taking ACE inhibitor, beta-blocker, or diuretics at home for quite some time. She has also had lack of primary care. She previously had home health care in the home, however she has not had this in some time either. All of these factors appear to be playing a role in her exacerbation of heart failure now. The patient is not currently having any chest pains. First set of troponins were found to be negative. We will reinitiate the patient on ACE inhibitor as well as beta-blocker. The patient was also on digoxin previously which we will restart. We will start the patient on diuresis with Lasix 40 mg IV q.12 hours. We will also obtain Cardiology consult for this. The patient had 2-D echocardiogram performed in October 2012, therefore, I do not feel one is necessary at this point in time. We will cycle cardiac enzymes for now and monitor the patient on telemetry as well.  2. Hypertension. Blood pressure was found to be quite elevated and is playing a role in her heart failure exacerbation. We will resume Coreg 3.125 mg p.o. b.i.d. and lisinopril 10 mg p.o. daily.  3. Diabetes mellitus. Blood sugar  is noted to be quite elevated. She has likely not been taking her metformin or glimepiride. We will resume glimepiride and metformin and will provide the patient with sliding scale insulin for now.   4. Hyperlipidemia. We will recheck her lipid profile now. Previously she was on pravastatin 10 mg daily and we will restart this.  5. Pyuria. The patient is nitrite positive and also has evidence for pyuria. She has had some intermittent dysuria. High blood sugars are likely contributing to the possibility of underlying urinary tract infection. We will start the patient on Levaquin 500 mg p.o. daily for this and also obtain urine culture.  6. DVT prophylaxis. Will provide the patient with heparin 5000 units sub-Q q.8 hours.   CODE STATUS: FULL CODE.      TIME SPENT: Over one hour.  ____________________________ Lennox PippinsMunsoor N. Conny Situ, MD mnl:drc D: 11/17/2011 04:16:03 ET T: 11/17/2011 09:24:19 ET JOB#: [id number]  cc: Lennox PippinsMunsoor N. Deronte Solis, MD, <Dictator> Ria CommentMUNSOOR N Carly Applegate MD ELECTRONICALLY SIGNED 11/21/2011 20:49

## 2015-02-04 NOTE — Op Note (Signed)
PATIENT NAME:  [name], [name] MR#:  [id number] DATE OF BIRTH:  July 24, 1941  DATE OF PROCEDURE:  01/13/2012  PREOPERATIVE DIAGNOSIS: Cataract, left eye.   POSTOPERATIVE DIAGNOSIS: Cataract, left eye.   PROCEDURE PERFORMED: Extracapsular cataract extraction using phacoemulsification with placement of an Alcon [id number] 22.5-diopter posterior chamber lens, serial number [id number].   SURGEON: Maylon PeppersSteven A. Emeril Stille, M.D.   ANESTHESIA: 4% lidocaine and 0.75% Marcaine a 50-50 mixture with 10 units/mL of  Hylenex added given as a peribulbar.   ANESTHESIOLOGIST: Yves DillPaul Carroll, MD    COMPLICATIONS: None.   ESTIMATED BLOOD LOSS: Less than 1 mL.   DESCRIPTION OF PROCEDURE: The patient was brought to the Operating Room and given a peribulbar block. She was then prepped and draped in the usual fashion. The vertical rectus muscles were imbricated using 5-0 silk sutures, bridle sutures. A limbal peritomy was carried out for one clock hour, and hemostasis was obtained with cautery. This was done at 12:00. A partial thickness scleral groove was made at the posterior surgical limbus and dissected anteriorly into clear cornea with an Alcon crescent knife. The area in the anterior chamber was entered superotemporally through clear cornea with a paracentesis knife and through the lamellar section with a 2.6 mm keratome. DisCoVisc was used to replace the aqueous, and a continuous tear circular capsulorrhexis was carried out. Hydrodelineation was used to loosen the nucleus. Phacoemulsification was carried out in a divide and conquer technique. The nucleus was one of the hardest nuclei that I have ever sculpted. It was removed using a careful divide and conquer technique and multiple maneuvers to split it into four quadrants. Ultrasound time was 8 minutes and 48 seconds with an average power of 31.1%. CDE was 252.8. Irrigation-aspiration was used to remove the very little residual cortex. The capsular bag was inflated  with DisCoVisc, and the lens was inserted in the capsular bag under direct visualization. Irrigation-aspiration was used to remove the residual DisCoVisc. The wound was inflated with balanced salt, and a single 10-0 nylon suture was placed across the wound. Miostat was injected through the paracentesis tract to induce miosis and deepen the chamber. The wound was checked for leaks and none were found. The conjunctiva was closed with cautery. The bridle sutures were removed. Two drops of Vigamox were placed in the eye. A shield was placed on the eye. The patient was discharged to the recovery room in good condition.   ____________________________ Maylon PeppersSteven A. Letta Cargile, MD sad:cbb D: 01/13/2012 13:57:04 ET T: 01/13/2012 15:22:26 ET JOB#: [id number]  cc: Viviann SpareSteven A. Davonte Siebenaler, MD, <Dictator> Erline LevineSTEVEN A Ceaira Ernster MD ELECTRONICALLY SIGNED 01/19/2012 12:15

## 2015-02-04 NOTE — Consult Note (Signed)
General Aspect 73 year old female with cardiomyopathy (presumed to be nonischemic), EF <25% on last admission 03/49, h/o systolic CHF, no follow up with PMD, not on meds?, diabetes, TIA, who presents with shortness of breath starting this weekend.  She reports last week, she was doing well. She does not go outside the house much. She drinks significant water. Denies eating out much. She does not check her sugars.   In the Emergency Room, she reported she had not been taking her medications.    Present Illness . PAST MEDICAL HISTORY:  1.  hospitalized September 2011 with a transient ischemic attack. At that time an echocardiogram was performed which showed an ejection fraction of less than 25%.  2. Diabetes.  3. Hypertension.  4. History of renal artery stenosis.  5. Hyperlipidemia.  6. Mild cognitive impairment.  7. Cardiomyopathy, systolic, with ejection fraction of less than 25% by echocardiogram September 2011.  8.  cardiac catheterization in February 2011 which showed no angiographic evidence of occlusive coronary artery disease.   FAMILY HISTORY: Positive for lung cancer, hypertension, and colon cancer.   SOCIAL HISTORY: The patient lives with her husband and her daughter. No tobacco, alcohol, or drug use.   Physical Exam:   GEN WD, WN, NAD    HEENT red conjunctivae    NECK supple  No masses    RESP normal resp effort  rhonchi  dull at the bases    CARD Tachycardic  Murmur    Murmur Systolic    ABD soft    LYMPH negative neck    EXTR positive edema, trace to 1+ LE    SKIN normal to palpation    NEURO cranial nerves intact, motor/sensory function intact    PSYCH alert, A+O to time, place, person, poor insight   Review of Systems:   Subjective/Chief Complaint SOB, malaise    General: Weakness    Skin: No Complaints    ENT: No Complaints    Eyes: No Complaints    Neck: No Complaints    Respiratory: Short of breath    Cardiovascular: Dyspnea     Gastrointestinal: No Complaints    Genitourinary: No Complaints    Vascular: No Complaints    Musculoskeletal: No Complaints    Neurologic: No Complaints    Hematologic: No Complaints    Endocrine: No Complaints    Psychiatric: No Complaints    Review of Systems: All other systems were reviewed and found to be negative    Medications/Allergies Reviewed Medications/Allergies reviewed    (Removed):        Admit Diagnosis:   ACUTE SYSTOLIC HEART FAILURE: [id number], Active, ACUTE SYSTOLIC HEART FAILURE      Admit Reason:   Acute systolic heart failure: (794.80) Active, ICD9, Acute systolic heart failure  Home Medications:  pravastatin 10 mg oral tablet: 1 tab(s) orally once a day , Active  aspirin 81 mg oral tablet: 1 tab(s) orally once a day, Active  carvedilol 3.125 mg oral tablet: 1 tab(s) orally 2 times a day for hypertension., Active  lisinopril 10 mg oral tablet: 1 tab(s) orally once a day, Active  metformin 1000 mg oral tablet: 1 tab(s) orally 2 times a day for diabetes., Active  glimepiride 2 mg oral tablet: 1 tab(s) orally once a day before breakfast for diabetes., Active  Lasix 20 mg oral tablet: 1 tab(s) orally once a day, Active    Routine Chem:  04-Feb-13 01:39    Glucose, Serum 332   BUN 17  Creatinine (comp) 0.45   Sodium, Serum 139   Potassium, Serum 4.7   Chloride, Serum 99   CO2, Serum 28   Calcium (Total), Serum 8.6  Hepatic:  04-Feb-13 01:39    Bilirubin, Total 0.9   Alkaline Phosphatase 148   SGPT (ALT) 26   SGOT (AST) 52   Total Protein, Serum 7.1   Albumin, Serum 3.0  Routine Chem:  04-Feb-13 01:39    Osmolality (calc) 292   eGFR (African American) >60   eGFR (Non-African American) >60   Anion Gap 12  Routine Hem:  04-Feb-13 01:39    WBC (CBC) 12.1   RBC (CBC) 4.44   Hemoglobin (CBC) 12.8   Hematocrit (CBC) 38.9   Platelet Count (CBC) 247   MCV 88   MCH 28.9   MCHC 32.9   RDW 13.4  Routine Chem:  04-Feb-13 01:39     Magnesium, Serum 1.7  Routine Coag:  04-Feb-13 01:39    Prothrombin 13.7   INR 1.0  Cardiac:  04-Feb-13 01:39    Troponin I < 0.02  Thyroid:  04-Feb-13 01:39    Thyroid Stimulating Hormone 1.03  TDMs:  04-Feb-13 01:39    Digoxin, Serum < 0.1  Routine Chem:  04-Feb-13 01:39    B-Type Natriuretic Peptide [facility]) 8195  Routine UA:  04-Feb-13 01:39    Color (UA) Yellow   Clarity (UA) Cloudy   Glucose (UA) >=500   Bilirubin (UA) Negative   Ketones (UA) Trace   Specific Gravity (UA) 1.021   Blood (UA) 1+   pH (UA) 5.0   Nitrite (UA) Positive   Leukocyte Esterase (UA) 1+   RBC (UA) 6 /HPF   WBC (UA) 160 /HPF   Bacteria (UA) 3+   Epithelial Cells (UA) <1 /HPF   Mucous (UA) PRESENT  Cardiac:  04-Feb-13 01:39    CK, Total 101   CPK-MB, Serum 0.7   EKG:   Interpretation EKG shows sinus tachycardsia rate 122, LAD, nonspecific ST ABN   Radiology Results: XRay:    04-Feb-13 00:53, Chest Portable Single View   Chest Portable Single View    REASON FOR EXAM:    sob  COMMENTS:       PROCEDURE: DXR - DXR PORTABLE CHEST SINGLE VIEW  - Nov 17 2011 12:53AM     RESULT: Comparison: 07/31/2011    Findings:     Single portable AP chest radiograph is provided. There is bilateral mild   interstitial thickening. There is hazy right lower lobe airspace disease.   There may be trace bilateral pleural effusions. The heart size is   enlarged. The osseous structures are unremarkable.    IMPRESSION:   Bilateral mild interstitial thickening concerning for mild pulmonary   edema. There is hazy right lower lobe airspace disease which may   represent atelectasis versus developing infiltrate. Correlate with   laboratory values.          Verified By: Jennette Banker, M.D., MD    PCN: Alt Ment Status  Vital Signs/Nurse's Notes: **Vital Signs.:   04-Feb-13 06:04   Vital Signs Type Admission   Celsius 36.4   Temperature Source oral   Pulse Pulse 111   Pulse source per Dinamap    Respirations Respirations 18   Systolic BP Systolic BP 056   Diastolic BP (mmHg) Diastolic BP (mmHg) 81   Mean BP 110   BP Source Dinamap   Pulse Ox % Pulse Ox % 94   Pulse Ox Activity Level  At rest   Oxygen Delivery Room Air/ 21 %    07:34   Vital Signs Type POCT   Nurse Fingerstick (mg/dL) FSBS (fasting range 65-99 mg/dL) 315   Comments/Interventions  Nurse Notified     Impression 74 year old female with cardiomyopathy, EF <25% on last admission 80/06, h/o systolic CHF, no follow up with PMD, not on meds, diabetes, TIA, who presents with shortness of breath starting this weekend.  A/P: 1) SOB: Suspect secondary to systolic CHF Her heart rate is elevated (120s), likely from poor LV function. Would continue with lasix IV BID, ACE as tolerated. Given elevated rate, would continue coreg. We can titrate upwards very slowly --She will need outpt nursing and follow up Need CHF teaching  2) Diabetes medication noncompliant Will need to talk with daughter as patient and husband do not drive.  3)  HTN:  restart coreg/lisinopril,  Low Na diet. Can titrate lisinopril upwards as tolerated  4).  hyperlipidemia:  restart pravastatin,   Electronic Signatures: Ida Rogue (MD)  (Signed 04-Feb-13 09:15)  Authored: General Aspect/Present Illness, History and Physical Exam, Review of System, Past Medical History, Health Issues, Home Medications, Labs, EKG , Radiology, Allergies, Vital Signs/Nurse's Notes, Impression/Plan   Last Updated: 04-Feb-13 09:15 by Ida Rogue (MD)

## 2015-02-04 NOTE — Op Note (Signed)
PATIENT NAME:  [name], [name] MR#:  [id number] DATE OF BIRTH:  07/23/1941  DATE OF PROCEDURE:  02/02/2012  PREOPERATIVE DIAGNOSIS: Cataract, right eye.   POSTOPERATIVE DIAGNOSIS: Cataract, right eye.  PROCEDURE PERFORMED: Extracapsular cataract extraction using phacoemulsification and VisionBlue with placement of an SN6CWS, 22.5-diopter posterior chamber lens, serial number [id number].   SURGEON: Maylon PeppersSteven A. Caren Garske, MD   ANESTHESIA: 4% lidocaine and 0.75% Marcaine in a 50-50 mixture with 10 units/mL of Hylenex added, given as a peribulbar.   ANESTHESIOLOGIST: Cena BentonG. F. Van Staveren, MD   COMPLICATIONS: None.   ESTIMATED BLOOD LOSS: Less than 1 mL.   DESCRIPTION OF PROCEDURE: The patient was brought to the Operating Room and given IV sedation and a peribulbar block. She was prepped and draped in the usual fashion. The vertical rectus muscles were imbricated using 5-0 silk sutures, bridle sutures. A limbal peritomy was carried out for one clock hour at 90 degrees, and hemostasis was obtained with cautery. A partial thickness scleral groove was made posteriorly in the surgical limbus and dissected anteriorly into clear cornea with an Alcon crescent knife. The anterior chamber was entered superonasally and superotemporally using a paracentesis knife. Air was used to replace the aqueous. A small amount of DisCoVisc was placed inside the paracentesis wounds to hold back the air. The anterior capsule was then patent with VisionBlue. DisCoVisc was used to replace the air, and the anterior chamber was entered through the lamellar dissection with a 2.6 mm keratome. Continuous tear circular capsulorrhexis was carried out. Hydrodelineation was used to loosen the nucleus. Phacoemulsification was then carried out in a divide and conquer technique. The lens was 4+ hard, and ultrasound time was 5 minutes and 44 seconds with an average power of 30.7% and a CDE of 176.5. Irrigation/aspiration was used to remove  the very little cortex that remained superiorly. The capsular bag was inflated with DisCoVisc, and the intraocular lens was inserted into the capsular bag using an AcrySert Delivery System. Irrigation and aspiration were used to remove the residual DisCoVisc. The wound was inflated with balanced salt, and Miostat was injected through the paracentesis tract to deepen the chamber and induce miosis. There was a slight leak at the wound, and so a single 10-0 nylon suture was placed. The wound was rechecked and found to be secure. The conjunctiva was closed with cautery. The bridle sutures were removed. Two drops of Vigamox were placed in the eye. A shield was placed on the eye. The patient was discharged to the recovery room in good condition.   ____________________________ Maylon PeppersSteven A. Marquail Bradwell, MD sad:cbb D: 02/02/2012 13:04:59 ET T: 02/02/2012 13:28:54 ET JOB#: [id number]  cc: Viviann SpareSteven A. Camyra Vaeth, MD, <Dictator> Erline LevineSTEVEN A Maeci Kalbfleisch MD ELECTRONICALLY SIGNED 02/09/2012 13:58

## 2015-02-04 NOTE — Discharge Summary (Signed)
PATIENT NAME:  [name], [name] MR#:  [id number] DATE OF BIRTH:  08/08/1941  DATE OF ADMISSION:  11/24/2011 DATE OF DISCHARGE:  11/27/2011  PRESENTING COMPLAINT: Altered mental status, low blood sugar.   DISCHARGE DIAGNOSES:  1. Altered mental status, acute encephalopathy secondary to hypoglycemia, resolved.  2. Hypertension.  3. Cardiomyopathy with history of systolic function. Patient is well compensated.  4. Type 2 diabetes.   CONDITION ON DISCHARGE: Fair. Vitals stable.   MEDICATIONS:  1. Pravastatin 10 mg daily.  2. Aspirin 81 mg daily.  3. Carvedilol 3.125, 1 tablet b.i.d.  4. Lisinopril 10 mg daily.  5. Metformin 1000 mg b.i.d.  6. Digoxin 0.125 p.o. daily.  7. Lasix 20 mg b.i.d.  8. KCl 10 mEq daily.  9. Patient advised not to take glimepiride.   DIET: Low sodium, ADA 1800 calorie diet.   DISCHARGE INSTRUCTIONS:  1. Home health services.  2. Patient advised to check sugars at least 1 to 2 times a day.  3. Follow up with Dr. Ronna PolioJennifer Miles, your PCP, on 12/12/2011 at 8:45 a.m.  4. Follow up with Dr. Mariah Miles 12/11/2011 at 2:45 p.m.   LABORATORY, DIAGNOSTIC AND RADIOLOGICAL DATA: Labs on discharge: Blood glucose 210. CBC within normal limits. Hemoglobin and hematocrit 11.2 and 34.0. Basic metabolic panel within normal limits. Serum digoxin 0.84. Urine culture no growth in 18 to 24 hours. Cardiac enzymes were negative. Ultrasound carotid Doppler demonstrated presence of atherosclerotic calcified plaque bilaterally without evidence of stenosis. Hemoglobin A1c 8.8. 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 Stool negative for WBCs or RBCs. Stool cultures remain negative. Clostridium difficile is negative.   BRIEF SUMMARY OF HOSPITAL COURSE: [name] is a 74 year old Caucasian female came into the Emergency Room with:  1. Altered mental status/encephalopathy/syncopal episode. It was suspected  metabolic due to hypoglycemia. She received D50 in the ER, hypoglycemia resolved. Patient came back to her baseline rather quickly. She was back on her diet. We slowly introduced her metformin. I told patient to hold her glimepiride which can later be resumed by primary care physician. Patient was given glucometer by the social workers before discharge and she was taught and family was taught to check her sugars and document it for primary care physician to review. Patient's work-up for syncope thus far was negative with head CT and telemetry monitoring remained in normal sinus rhythm and negative carotid Doppler's. Patient also has known history of AS which could be possibly contributing to her cause for syncope. She will follow up with the cardiologist as outpatient.  2. Hyperglycemia, resolved. Metformin was resumed. Glimepiride can later on be resumed by primary care physician.  3. Asymptomatic urinary tract infection. Patient got Levaquin for three days. Urine culture is negative.  4. Dehydration/volume depletion. Patient remained euvolemic after IV fluids were given.  5. Chronic systolic heart failure, ejection fraction of 25% with history of aortic stenosis. Currently appears well compensated. Patient has dietary indiscretion, medical nonadherence and lack of primary care follow up. She was restarted back on her home medications. No pulmonary edema was noted. Lasix was also resumed.  6. Hypertension. Well controlled.  7. Hospital stay otherwise remained stable. Patient will be discharged to home with home health services.   TIME SPENT: 40 minutes. ____________________________ Wylie HailSona A. Allena KatzPatel, MD sap:cms D: 11/28/2011 06:39:00 ET T: 11/29/2011 12:25:17 ET  JOB#: [id number] cc: [name] A. Allena KatzPatel, MD, <Dictator> Ginette PitmanJennifer A. Dan HumphreysWalker, MD Antonieta Ibaimothy J. Gollan, MD [name]  A Lekita Kerekes MD ELECTRONICALLY SIGNED 12/07/2011 11:35

## 2015-02-04 NOTE — H&P (Signed)
PATIENT NAME:  [name], [name] MR#:  [id number] DATE OF BIRTH:  November 09, 1940  DATE OF ADMISSION:  11/24/2011  REFERRING PHYSICIAN: Dr. Dana AllanMarwan Powers   PRIMARY CARE PHYSICIAN:  It is supposed to be Dr. Ronna PolioJennifer Walker who she has not seen.   REASON FOR ADMISSION: Altered mental status, hypoglycemia,   HISTORY OF PRESENT ILLNESS: The patient is a poor historian. She did not remember why she is here. She is a 74 year old white female with past medical history of chronic congestive heart failure with ejection fraction 25%, hypertension, past transient ischemic attacks, diabetes, who has a recent history of C. difficile colitis who was just discharged a week ago with congestive heart failure exacerbation who presents after being found on the floor. She is unsure what happened. She cannot tell me the date, time where she is at. She can just tell me the president. Apparently, her blood sugar was 33. Now it is up to 325. She received an amp of D50 and IV fluids. Blood sugar is now 325. We are asked to admit the patient for hypoglycemia and altered mental status.   PAST MEDICAL HISTORY:  1. Congestive heart failure, systolic, ejection fraction 25%.  2. Hypertension.  3. Diabetes.  4. History of thrombocytopenia.  5. History of transient ischemic attack.  6. Onychomycosis.  7. Gastrointestinal bleeding.  8. Clostridium difficile infections. 9. Acute renal failure in the past.  10. Generalized debility.   PAST SURGICAL HISTORY:  1. Cholecystectomy.  2. C-sections.  ALLERGIES: Penicillin.   SOCIAL HISTORY: She resides at home with her husband, has two children. She is fairly independent. Denies alcohol or drug use.   FAMILY HISTORY: Mother died of colon cancer in her 4760s. Father died at 5554 secondary to lung cancer as per previous reports.   MEDICATIONS AT HOME: She does not know what she is taking, but she was discharged on: 1. Pravastatin 10 mg. 2. Aspirin 81 milligrams daily.  3. Carvedilol  3.125 mg p.o. b.i.d.  4. Lisinopril 10 mg daily.  5. Metformin 1000 mg p.o. twice daily. 6. Glimepiride 10 milligrams daily.  7. Digoxin 0.125 mg daily.  8. Lasix 20 mg twice daily.  9. She supposedly completed Levaquin by now.  10. KCL 10 mEq daily.   REVIEW OF SYSTEMS: Unobtainable as the patient could not answer my questions. She just stated she was weak and tired and did not answer the rest of the questions due to her mental status.   PHYSICAL EXAMINATION:  VITAL SIGNS: Temperature 95, heart rate 22, blood pressure 184/61, sating 94% on two liters now and blood sugar 33, heart rate 94, respirations 20, blood pressure 123/66, blood sugar is now 325.   GENERAL: The patient is well developed, well nourished, in no apparent distress.   HEENT: Pupils equal and reactive to light and accommodation. Extraocular movements intact. Anicteric sclera. No difficulty hearing. Oropharynx clear. Dry mucous membranes, poor dentition.   NECK: No JVD. No thyromegaly, no lymphadenopathy, no carotid bruits.   LUNGS: Clear to auscultation. No adventitious breath sounds. No increased respiratory effort. No crackles. Resonant to percussion.   CARDIOVASCULAR: Regular rate and rhythm. Normal S1, S2. No murmurs, rubs, or gallops were appreciated. PMI not lateralized. 2+ dorsalis pedis pulses. No lower extremity edema.   BREASTS: No obvious masses.    ABDOMEN: Soft, nontender, nondistended. Positive bowel sounds with surgical scar.   GU: Deferred.   MUSCULOSKELETAL: Strength 5/5. No clubbing, cyanosis, degenerative joint disease.   SKIN: No rashes, lesions, or  induration. She does have tenting of the skin.   LYMPH: No lymphadenopathy in the cervical or supraclavicular area.   NEUROLOGIC: Cranial nerves 2 through 12 are intact. Follows reflexes. No dysarthria, aphasia, or dysphagia.   PSYCH: Alert, but not oriented, confused.   LABORATORY, DIAGNOSTIC, AND RADIOLOGICAL DATA: Glucose 143, BUN 33,  creatinine 0.50, sodium 138, potassium 4.4, chloride 96, bicarbonate 30, anion gap 12, total protein 7.7, albumin 3.4, total bilirubin 0.6, alkaline phosphatase 81, AST 32, ALT 20, white count 12.8, hemoglobin 14.1, hematocrit 43, platelets 211, MCV 87. EKG shows normal sinus rhythm. No ST changes.   ASSESSMENT AND PLAN: 74 year old white female with past medical history of chronic systolic congestive heart failure, ejection fraction 25%, hypertension, diabetes, C. difficile colitis, medication noncompliance, who presents with altered mental status and hyperglycemia.  1. Altered mental status, syncope, most likely due to hyperglycemia. However, will check carotids as she does have a history of transient ischemic attack. She had echo on 07/25/2011. Will monitor on telemetry. Cycle her cardiac enzymes. Check TSH and urinalysis. Will get neuro checks every four hours.  2. Systolic heart failure with history of aortic stenosis. Due to dietary indiscretion and medication nonadherence in the past, we will restart her Coreg, lisinopril and digoxin. The patient actually looks dry. We will hold off on starting Lasix again. We will get chest x-ray see if there is any pulmonary vascular congestion. We will continue with gentle IV fluid hydration.  3. Hypertension. Restarted Coreg, lisinopril. The patient was hypotensive when she came in, now she is normotensive. Will continue with low sodium diet. We will monitor and hold medications if need be.  4. Diabetes, hyperglycemia. Will continue with D5 half normal saline very gently. Will check A1c as she may be overmedicated.  5. Dehydration, as evidenced by elevated BUN and dry mucous membranes. Will give IV fluids gently.  6. Hyperlipidemia. Restart her pravastatin.  7. CODE STATUS: FULL CODE.  8. Debilitation. We have consulted physical therapy and case management as the patient may need placement.   TOTAL TIME SPENT ON ADMISSION: 55 minutes.    ____________________________ Corie Chiquito Lafayette Dragon, MD aaf:ap D: 11/24/2011 11:10:13 ET T: 11/24/2011 11:43:31 ET JOB#: [id number]  cc: Karolee Ohs A. Lafayette Dragon, MD, <Dictator> Ginette Pitman. Dan Humphreys, MD Antonieta Iba, MD Karolee Ohs Laverda Page MD ELECTRONICALLY SIGNED 11/24/2011 22:22

## 2015-02-04 NOTE — Discharge Summary (Signed)
PATIENT NAME:  [name], [name] MR#:  [id number] DATE OF BIRTH:  1941/02/26  DATE OF ADMISSION:  11/17/2011 DATE OF DISCHARGE:  11/19/2011  ADMITTING DIAGNOSIS: Shortness of breath.   DISCHARGE DIAGNOSES:  1. Shortness of due to acute systolic congestive heart failure, now improved.  2. Medication noncompliance.  3. Diabetes.  4. Hyperlipidemia.  5. Urinary tract infection due to Klebsiella pneumoniae, on treatment with levofloxacin.  6. Hypertension.  7. Diabetes.  8. Hyperlipidemia.  9. History of chronic systolic congestive heart failure with ejection fraction of 25%.  10. History of thrombocytopenia.  11. History of transient ischemic attack.  12. Onychomycosis. 13. History of gastrointestinal bleeding.  14. Previous history of Clostridium difficile colitis.  15. Previous history of acute renal failure.  16. Generalized debility.  17. Status post cholecystectomy.  18. Status post cesarean section.  19. Moderate aortic stenosis.  PERTINENT LABS/EVALUATIONS: Sodium 139, potassium 4.7, chloride 99, CO2 28, BUN 17, creatinine 0.45, glucose 332. BNP 8195. LFTs: Total protein 7.1, albumin 3.0, total bilirubin 0.9, alkaline phosphatase 148, AST 52, ALT 26. Troponin less than 0.02. TSH 1.03. Digoxin less than 0.1. CBC: WBC 12.1, hemoglobin 12.8, platelet count 247.   Urinalysis showed glucose greater than 500, nitrites positive, leukocyte esterase 6, WBCs 160.   Echocardiogram Doppler from 08/04/2011 showed ejection fraction of less than 25% with mild diastolic dysfunction with mild mitral regurgitation and moderate aortic stenosis.   Most recent WBC on 11/18/2011 was 8.2. Hemoglobin A1c 9.1. Fasting lipid panel: Total cholesterol 158, triglycerides 202, HDL 28, LDL 90.   EKG showed sinus tachycardia, left ventricular hypertrophy.   HOSPITAL COURSE: Please see the History and Physical done by the admitting physician. The patient is a 74 year old white female with history of chronic  systolic congestive heart failure with an ejection fraction of less than 25%, hypertension, diabetes, transient ischemic attack, history of thrombocytopenia, and prior history of C. difficile colitis who has been noncompliant with her medications. She reports that she ran out of medications. She was also eating chips and other salty food. She presented with shortness of breath. In the ED, she was noted to be in acute systolic congestive heart failure. We were asked to admit the patient. The patient was treated with IV Lasix and was restarted on beta blocker and ACE inhibitor. With these treatments, her symptoms started to improve. Her blood sugars were also very poorly controlled due to medication noncompliance. She was restarted on p.o. medications. The patient also was noted to have a urinary tract infection with Klebsiella pneumonia, which was treated with Levaquin. At this point, she is doing much better. We have arranged home health to check on the patient to make sure she is taking her medications.   DISCHARGE MEDICATIONS:  1. Pravastatin 10 mg daily.  2. Aspirin 81 mg one tab p.o. daily.  3. Carvedilol 3.125 mg 1 tab p.o. twice a day. 4. Lisinopril 10 mg daily.  5. Metformin 1000 mg one tab p.o. twice a day. 6. Glimepiride 2 mg daily. 7. Digoxin 0.125 mg p.o. daily.  8. Lasix 20 mg p.o. twice a day. 9. Levaquin 500 mg daily x5 days.  10. KCl 10 mEq p.o. daily.   DIET: Low sodium, ADA diet.   ACTIVITY: As tolerated.   REFERRAL: Home health.    DISCHARGE FOLLOWUP: Followup with Dr. Ronna Polio on 12/22/2011. Follow up with Dr. Julien Nordmann in 2 to 3 weeks.   TIME SPENT: 35 minutes. ____________________________ Lacie Scotts Allena Katz, MD shp:slb D:  11/19/2011 10:37:43 ET (Entered as incorrect work type - 08) T: 11/19/2011 11:16:09 ET JOB#: [id number]  cc: Victorino DikeJennifer A. Dan HumphreysWalker, MD Athena Baltz H. Allena KatzPatel, MD, <Dictator> Charise CarwinSHREYANG H Andres Vest MD ELECTRONICALLY SIGNED 11/29/2011 10:01

## 2015-02-04 NOTE — H&P (Signed)
PATIENT NAME:  [name], [name] MR#:  [id number] DATE OF BIRTH:  Aug 29, 1941  DATE OF ADMISSION:  11/24/2011    ____________________________ Elon AlasKamran N. Birney Belshe, MD knl:ljs D: 11/28/2011 18:33:23 ET T: 11/29/2011 07:07:20 ET JOB#: [id number]  cc: Elon AlasKamran N. Annalee Meyerhoff, MD, <Dictator> Elon AlasKAMRAN N Mylasia Vorhees MD ELECTRONICALLY SIGNED 11/30/2011 3:26

## 2021-02-20 ENCOUNTER — Other Ambulatory Visit: Payer: Self-pay
